# Patient Record
Sex: Female | Born: 1975 | Race: Black or African American | Hispanic: No | Marital: Married | Smoking: Former smoker
Health system: Southern US, Community
[De-identification: ages and names within clinical notes are randomized; demographics above are authoritative.]

## PROBLEM LIST (undated history)

## (undated) DIAGNOSIS — R112 Nausea with vomiting, unspecified: Secondary | ICD-10-CM

## (undated) DIAGNOSIS — R202 Paresthesia of skin: Secondary | ICD-10-CM

## (undated) HISTORY — DX: Paresthesia of skin: R20.2

## (undated) HISTORY — PX: BRAIN TUMOR EXCISION: SHX577

---

## 2003-01-27 ENCOUNTER — Other Ambulatory Visit: Admission: RE | Admit: 2003-01-27 | Discharge: 2003-01-27 | Payer: Self-pay | Admitting: *Deleted

## 2003-04-21 ENCOUNTER — Other Ambulatory Visit: Admission: RE | Admit: 2003-04-21 | Discharge: 2003-04-21 | Payer: Self-pay | Admitting: *Deleted

## 2004-01-20 ENCOUNTER — Other Ambulatory Visit: Admission: RE | Admit: 2004-01-20 | Discharge: 2004-01-20 | Payer: Self-pay | Admitting: *Deleted

## 2005-03-02 ENCOUNTER — Other Ambulatory Visit: Admission: RE | Admit: 2005-03-02 | Discharge: 2005-03-02 | Payer: Self-pay | Admitting: *Deleted

## 2006-03-07 ENCOUNTER — Other Ambulatory Visit: Admission: RE | Admit: 2006-03-07 | Discharge: 2006-03-07 | Payer: Self-pay | Admitting: *Deleted

## 2007-05-13 ENCOUNTER — Inpatient Hospital Stay (HOSPITAL_COMMUNITY): Admission: AD | Admit: 2007-05-13 | Discharge: 2007-05-13 | Payer: Self-pay | Admitting: Obstetrics and Gynecology

## 2007-05-14 ENCOUNTER — Inpatient Hospital Stay (HOSPITAL_COMMUNITY): Admission: AD | Admit: 2007-05-14 | Discharge: 2007-05-16 | Payer: Self-pay | Admitting: Obstetrics and Gynecology

## 2008-05-27 ENCOUNTER — Ambulatory Visit (HOSPITAL_COMMUNITY): Admission: RE | Admit: 2008-05-27 | Discharge: 2008-05-27 | Payer: Self-pay | Admitting: Gynecology

## 2008-10-16 ENCOUNTER — Other Ambulatory Visit: Admission: RE | Admit: 2008-10-16 | Discharge: 2008-10-16 | Payer: Self-pay | Admitting: Gynecology

## 2008-11-07 HISTORY — PX: ECTOPIC PREGNANCY SURGERY: SHX613

## 2009-07-22 ENCOUNTER — Encounter (INDEPENDENT_AMBULATORY_CARE_PROVIDER_SITE_OTHER): Payer: Self-pay | Admitting: Obstetrics and Gynecology

## 2009-07-22 ENCOUNTER — Ambulatory Visit (HOSPITAL_COMMUNITY): Admission: AD | Admit: 2009-07-22 | Discharge: 2009-07-23 | Payer: Self-pay | Admitting: Obstetrics and Gynecology

## 2009-10-06 ENCOUNTER — Ambulatory Visit (HOSPITAL_COMMUNITY): Admission: RE | Admit: 2009-10-06 | Discharge: 2009-10-06 | Payer: Self-pay | Admitting: Gynecology

## 2009-11-07 HISTORY — PX: HYSTEROSCOPY: SHX211

## 2010-11-05 ENCOUNTER — Ambulatory Visit (HOSPITAL_COMMUNITY)
Admission: RE | Admit: 2010-11-05 | Discharge: 2010-11-05 | Payer: Self-pay | Source: Home / Self Care | Attending: Obstetrics and Gynecology | Admitting: Obstetrics and Gynecology

## 2010-11-26 NOTE — Op Note (Signed)
  NAME:  Olivia Bailey, Olivia Bailey               ACCOUNT NO.:  [id number]  MEDICAL RECORD NO.:  [id number]          PATIENT TYPE:  AMB  LOCATION:  SDC                           FACILITY:  [facility]  PHYSICIAN:  Michelle L. Grewal, M.D.DATE OF BIRTH:  04-23-1976  DATE OF PROCEDURE:  11/05/2010 DATE OF DISCHARGE:  11/05/2010                              OPERATIVE REPORT   PREOPERATIVE DIAGNOSIS:  Infertility and intrauterine adhesion.  POSTOPERATIVE DIAGNOSIS:  Normal uterine cavity and infertility.  PROCEDURE:  Cervical dilation and hysteroscopy.  SURGEON:  Michelle L. Vincente Poli, MD  ANESTHESIA:  MAC with local.  FINDINGS:  Normal cavity.  SPECIMENS:  None.  ESTIMATED BLOOD LOSS:  Minimal.  COMPLICATIONS:  None.  DESCRIPTION OF PROCEDURE:  The patient was taken to the operating room. Anesthesia was administered.  She was then prepped and draped in usual fashion.  In-and-out catheter was used to empty the bladder.  The speculum was inserted into the vagina.  The cervix grasped with a tenaculum.  Of note, there was NuvaRing in the vagina that was removed prior to this, was then inserted at the end of the case.  The hysteroscope was inserted to the uterine cavity after the cervix was gently dilated using Pratt dilators and after paracervical block was performed.  The entire uterine cavity was inspected.  It was noted to be very thin and atrophic.  Tubal ostia were seen and noted to be normal. There was no evidence of any intrauterine adhesions, cavity contour was normal.  No fibroid.  No polyp noted.  Multiple pictures were taken. All instruments were removed from the vagina.  All sponge, lap, and instrument counts were correct x2.  The patient went to recovery room in stable condition.     Michelle L. Vincente Poli, M.D.     Florestine Avers  D:  11/05/2010  T:  11/06/2010  Job:  [id number]  Electronically Signed by Marcelle Overlie M.D. on 11/26/2010 08:43:35 AM

## 2011-01-17 LAB — CBC
MCH: 28.1 pg (ref 26.0–34.0)
MCHC: 33.3 g/dL (ref 30.0–36.0)
MCV: 84.4 fL (ref 78.0–100.0)
Platelets: 224 10*3/uL (ref 150–400)
RBC: 4.41 MIL/uL (ref 3.87–5.11)

## 2011-01-17 LAB — PREGNANCY, URINE: Preg Test, Ur: NEGATIVE

## 2011-02-11 LAB — CBC
HCT: 33.9 % — ABNORMAL LOW (ref 36.0–46.0)
Hemoglobin: 11.5 g/dL — ABNORMAL LOW (ref 12.0–15.0)
MCHC: 33.9 g/dL (ref 30.0–36.0)
MCHC: 32.8 g/dL (ref 30.0–36.0)
MCV: 88.2 fL (ref 78.0–100.0)
Platelets: 198 10*3/uL (ref 150–400)
Platelets: 236 10*3/uL (ref 150–400)
RBC: 4.26 MIL/uL (ref 3.87–5.11)
RDW: 13 % (ref 11.5–15.5)
WBC: 6.2 10*3/uL (ref 4.0–10.5)

## 2011-02-11 LAB — TYPE AND SCREEN
ABO/RH(D): O POS
Antibody Screen: NEGATIVE

## 2011-02-15 LAB — ABO/RH: RH Type: POSITIVE

## 2011-02-15 LAB — RPR: RPR: NONREACTIVE

## 2011-02-15 LAB — HEPATITIS B SURFACE ANTIGEN: Hepatitis B Surface Ag: NEGATIVE

## 2011-07-08 ENCOUNTER — Ambulatory Visit (INDEPENDENT_AMBULATORY_CARE_PROVIDER_SITE_OTHER): Payer: Self-pay | Admitting: *Deleted

## 2011-07-08 DIAGNOSIS — O30009 Twin pregnancy, unspecified number of placenta and unspecified number of amniotic sacs, unspecified trimester: Secondary | ICD-10-CM

## 2011-07-15 ENCOUNTER — Ambulatory Visit (INDEPENDENT_AMBULATORY_CARE_PROVIDER_SITE_OTHER): Payer: BC Managed Care – PPO | Admitting: *Deleted

## 2011-07-15 VITALS — BP 110/70

## 2011-07-15 DIAGNOSIS — O30009 Twin pregnancy, unspecified number of placenta and unspecified number of amniotic sacs, unspecified trimester: Secondary | ICD-10-CM

## 2011-07-15 NOTE — Progress Notes (Signed)
P = 93  Pt here for NST today

## 2011-07-15 NOTE — Progress Notes (Deleted)
P = 93   Pt here for NST today.  Copy of report sent to Dr. Renaldo Fiddler w/pt today

## 2011-07-22 ENCOUNTER — Ambulatory Visit (INDEPENDENT_AMBULATORY_CARE_PROVIDER_SITE_OTHER): Payer: BC Managed Care – PPO | Admitting: *Deleted

## 2011-07-22 VITALS — BP 110/67

## 2011-07-22 DIAGNOSIS — O30009 Twin pregnancy, unspecified number of placenta and unspecified number of amniotic sacs, unspecified trimester: Secondary | ICD-10-CM

## 2011-07-22 NOTE — Progress Notes (Signed)
P = 83    NST today, then prenatal visit to follow w/Dr. Vincente Poli @ office

## 2011-07-27 ENCOUNTER — Ambulatory Visit (INDEPENDENT_AMBULATORY_CARE_PROVIDER_SITE_OTHER): Payer: BC Managed Care – PPO | Admitting: *Deleted

## 2011-07-27 VITALS — BP 116/69

## 2011-07-27 DIAGNOSIS — O30009 Twin pregnancy, unspecified number of placenta and unspecified number of amniotic sacs, unspecified trimester: Secondary | ICD-10-CM

## 2011-07-27 NOTE — Progress Notes (Signed)
P = 87   NST only today- to office appt. with Dr. Marcelle Overlie after NST

## 2011-07-29 ENCOUNTER — Other Ambulatory Visit: Payer: BC Managed Care – PPO

## 2011-08-01 NOTE — Patient Instructions (Addendum)
   Your procedure is scheduled on: [facility]  Enter through the Main Entrance of [facility] at: 11:30AM Pick up the phone at the desk and dial [phone]  Please call this number if you have any problems the morning of surgery: [phone]  Remember: Do not eat food after midnight  Do not drink clear liquids after 9am Take these medicines the morning of surgery with a SIP OF WATER:  Do not wear jewelry, make-up, or FINGER nail polish Do not wear lotions, powders, or perfumes.  You may wear deodorant. Do not shave 48 hours prior to surgery. Do not bring valuables to the hospital. Leave suitcase in the car. After Surgery it may be brought to your room. For patients being admitted to the hospital, checkout time is 11:00am the day of discharge.    Remember to use your hibiclens as instructed.

## 2011-08-02 ENCOUNTER — Encounter (HOSPITAL_COMMUNITY): Payer: Self-pay

## 2011-08-04 ENCOUNTER — Encounter (HOSPITAL_COMMUNITY): Payer: Self-pay

## 2011-08-04 ENCOUNTER — Encounter (HOSPITAL_COMMUNITY)
Admission: RE | Admit: 2011-08-04 | Discharge: 2011-08-04 | Disposition: A | Discharge status: Home / Self Care | Payer: BC Managed Care – PPO | Source: Ambulatory Visit | Attending: Obstetrics and Gynecology | Admitting: Obstetrics and Gynecology

## 2011-08-04 HISTORY — DX: Nausea with vomiting, unspecified: R11.2

## 2011-08-04 LAB — CBC
Hemoglobin: 10.1 g/dL — ABNORMAL LOW (ref 12.0–15.0)
MCH: 22.8 pg — ABNORMAL LOW (ref 26.0–34.0)
MCV: 75.6 fL — ABNORMAL LOW (ref 78.0–100.0)
Platelets: 157 10*3/uL (ref 150–400)
RBC: 4.43 MIL/uL (ref 3.87–5.11)
WBC: 8.3 10*3/uL (ref 4.0–10.5)

## 2011-08-04 LAB — SURGICAL PCR SCREEN: MRSA, PCR: NEGATIVE

## 2011-08-04 LAB — RPR: RPR Ser Ql: NONREACTIVE

## 2011-08-05 ENCOUNTER — Ambulatory Visit (INDEPENDENT_AMBULATORY_CARE_PROVIDER_SITE_OTHER): Payer: BC Managed Care – PPO | Admitting: *Deleted

## 2011-08-05 VITALS — BP 110/77

## 2011-08-05 DIAGNOSIS — O30009 Twin pregnancy, unspecified number of placenta and unspecified number of amniotic sacs, unspecified trimester: Secondary | ICD-10-CM

## 2011-08-05 NOTE — Progress Notes (Signed)
P = 86     NST only today. Office appt w/Dr. Vincente Poli today following NST.  Pt is scheduled for C/S on 08/10/11

## 2011-08-10 ENCOUNTER — Inpatient Hospital Stay (HOSPITAL_COMMUNITY): Payer: BC Managed Care – PPO | Admitting: Anesthesiology

## 2011-08-10 ENCOUNTER — Inpatient Hospital Stay (HOSPITAL_COMMUNITY)
Admission: RE | Admit: 2011-08-10 | Discharge: 2011-08-13 | DRG: 370 | Disposition: A | Discharge status: Home / Self Care | Payer: BC Managed Care – PPO | Source: Ambulatory Visit | Attending: Obstetrics and Gynecology | Admitting: Obstetrics and Gynecology

## 2011-08-10 ENCOUNTER — Encounter (HOSPITAL_COMMUNITY): Payer: Self-pay | Admitting: Anesthesiology

## 2011-08-10 ENCOUNTER — Encounter (HOSPITAL_COMMUNITY)
Admission: RE | Disposition: A | Discharge status: Home / Self Care | Payer: Self-pay | Source: Ambulatory Visit | Attending: Obstetrics and Gynecology

## 2011-08-10 ENCOUNTER — Encounter (HOSPITAL_COMMUNITY): Payer: Self-pay | Admitting: *Deleted

## 2011-08-10 ENCOUNTER — Other Ambulatory Visit: Payer: Self-pay | Admitting: Obstetrics and Gynecology

## 2011-08-10 DIAGNOSIS — D649 Anemia, unspecified: Secondary | ICD-10-CM | POA: Diagnosis not present

## 2011-08-10 DIAGNOSIS — Z302 Encounter for sterilization: Secondary | ICD-10-CM

## 2011-08-10 DIAGNOSIS — O30009 Twin pregnancy, unspecified number of placenta and unspecified number of amniotic sacs, unspecified trimester: Secondary | ICD-10-CM

## 2011-08-10 DIAGNOSIS — O3090 Multiple gestation, unspecified, unspecified trimester: Principal | ICD-10-CM | POA: Diagnosis present

## 2011-08-10 DIAGNOSIS — O9903 Anemia complicating the puerperium: Secondary | ICD-10-CM | POA: Diagnosis not present

## 2011-08-10 DIAGNOSIS — Z01812 Encounter for preprocedural laboratory examination: Secondary | ICD-10-CM

## 2011-08-10 DIAGNOSIS — Z01818 Encounter for other preprocedural examination: Secondary | ICD-10-CM

## 2011-08-10 DIAGNOSIS — Z98891 History of uterine scar from previous surgery: Secondary | ICD-10-CM

## 2011-08-10 SURGERY — Surgical Case
Anesthesia: Spinal | Laterality: Left | Wound class: Clean Contaminated

## 2011-08-10 MED ORDER — BISACODYL 10 MG RE SUPP
10.0000 mg | Freq: Every day | RECTAL | Status: DC | PRN
  Administered 2011-08-13: 10 mg via RECTAL
  Filled 2011-08-10: qty 1

## 2011-08-10 MED ORDER — ONDANSETRON HCL 4 MG/2ML IJ SOLN
4.0000 mg | INTRAMUSCULAR | Status: DC | PRN
  Administered 2011-08-10: 4 mg via INTRAVENOUS
  Filled 2011-08-10: qty 2

## 2011-08-10 MED ORDER — OXYCODONE-ACETAMINOPHEN 5-325 MG PO TABS
1.0000 | ORAL_TABLET | ORAL | Status: DC | PRN
  Administered 2011-08-11 – 2011-08-12 (×5): 1 via ORAL
  Administered 2011-08-12: 2 via ORAL
  Administered 2011-08-12 – 2011-08-13 (×5): 1 via ORAL
  Filled 2011-08-10 (×9): qty 1
  Filled 2011-08-10: qty 2
  Filled 2011-08-10: qty 1

## 2011-08-10 MED ORDER — NALOXONE HCL 0.4 MG/ML IJ SOLN: 0.4000 mg | INTRAMUSCULAR | Status: DC | PRN

## 2011-08-10 MED ORDER — DIPHENHYDRAMINE HCL 25 MG PO CAPS
25.0000 mg | ORAL_CAPSULE | ORAL | Status: DC | PRN
  Administered 2011-08-11 – 2011-08-13 (×7): 25 mg via ORAL
  Filled 2011-08-10 (×7): qty 1

## 2011-08-10 MED ORDER — GENTAMICIN IN SALINE 1-0.9 MG/ML-% IV SOLN: 100.0000 mg | Freq: Once | INTRAVENOUS | Status: DC

## 2011-08-10 MED ORDER — KETOROLAC TROMETHAMINE 30 MG/ML IJ SOLN
30.0000 mg | Freq: Four times a day (QID) | INTRAMUSCULAR | Status: AC | PRN
  Administered 2011-08-10 (×2): 30 mg via INTRAVENOUS
  Filled 2011-08-10: qty 1

## 2011-08-10 MED ORDER — OXYTOCIN 20 UNITS IN LACTATED RINGERS INFUSION - SIMPLE
125.0000 mL/h | INTRAVENOUS | Status: AC
  Administered 2011-08-10: 125 mL/h via INTRAVENOUS

## 2011-08-10 MED ORDER — SODIUM CHLORIDE 0.9 % IJ SOLN: 3.0000 mL | INTRAMUSCULAR | Status: DC | PRN

## 2011-08-10 MED ORDER — IBUPROFEN 600 MG PO TABS
600.0000 mg | ORAL_TABLET | Freq: Four times a day (QID) | ORAL | Status: DC | PRN
  Filled 2011-08-10 (×10): qty 1

## 2011-08-10 MED ORDER — ONDANSETRON HCL 4 MG/2ML IJ SOLN
INTRAMUSCULAR | Status: AC
  Filled 2011-08-10: qty 2

## 2011-08-10 MED ORDER — KETOROLAC TROMETHAMINE 30 MG/ML IJ SOLN: 30.0000 mg | Freq: Four times a day (QID) | INTRAMUSCULAR | Status: AC | PRN

## 2011-08-10 MED ORDER — GENTAMICIN SULFATE 40 MG/ML IJ SOLN
Freq: Once | INTRAVENOUS | Status: DC
  Filled 2011-08-10: qty 2.5

## 2011-08-10 MED ORDER — METOCLOPRAMIDE HCL 5 MG/ML IJ SOLN: 10.0000 mg | Freq: Once | INTRAMUSCULAR | Status: DC | PRN

## 2011-08-10 MED ORDER — OXYTOCIN 20 UNITS IN LACTATED RINGERS INFUSION - SIMPLE
INTRAVENOUS | Status: AC
  Administered 2011-08-10: 125 mL/h via INTRAVENOUS
  Filled 2011-08-10: qty 1000

## 2011-08-10 MED ORDER — HYDROMORPHONE HCL 1 MG/ML IJ SOLN
0.2500 mg | INTRAMUSCULAR | Status: DC | PRN
  Administered 2011-08-10 (×2): 0.5 mg via INTRAVENOUS

## 2011-08-10 MED ORDER — SENNOSIDES-DOCUSATE SODIUM 8.6-50 MG PO TABS
2.0000 | ORAL_TABLET | Freq: Every day | ORAL | Status: DC
  Administered 2011-08-11 – 2011-08-12 (×2): 2 via ORAL

## 2011-08-10 MED ORDER — GENTAMICIN SULFATE 40 MG/ML IJ SOLN
INTRAVENOUS | Status: DC | PRN
  Administered 2011-08-10: 100 mL via INTRAVENOUS

## 2011-08-10 MED ORDER — LACTATED RINGERS IV SOLN
INTRAVENOUS | Status: DC
  Administered 2011-08-10 – 2011-08-11 (×2): via INTRAVENOUS

## 2011-08-10 MED ORDER — FENTANYL CITRATE 0.05 MG/ML IJ SOLN
INTRAMUSCULAR | Status: AC
  Filled 2011-08-10: qty 2

## 2011-08-10 MED ORDER — SCOPOLAMINE 1 MG/3DAYS TD PT72
1.0000 | MEDICATED_PATCH | Freq: Once | TRANSDERMAL | Status: DC
  Administered 2011-08-10: 1.5 mg via TRANSDERMAL

## 2011-08-10 MED ORDER — BUPIVACAINE IN DEXTROSE 0.75-8.25 % IT SOLN
INTRATHECAL | Status: DC | PRN
Start: 2011-08-10 — End: 2011-08-10
  Administered 2011-08-10: 1.4 mL via INTRATHECAL

## 2011-08-10 MED ORDER — WITCH HAZEL-GLYCERIN EX PADS: 1.0000 "application " | MEDICATED_PAD | CUTANEOUS | Status: DC | PRN

## 2011-08-10 MED ORDER — MORPHINE SULFATE (PF) 0.5 MG/ML IJ SOLN
INTRAMUSCULAR | Status: DC | PRN
  Administered 2011-08-10: .15 mg via INTRATHECAL

## 2011-08-10 MED ORDER — ONDANSETRON HCL 4 MG/2ML IJ SOLN
INTRAMUSCULAR | Status: DC | PRN
  Administered 2011-08-10: 4 mg via INTRAVENOUS

## 2011-08-10 MED ORDER — TETANUS-DIPHTH-ACELL PERTUSSIS 5-2.5-18.5 LF-MCG/0.5 IM SUSP: 0.5000 mL | Freq: Once | INTRAMUSCULAR | Status: DC

## 2011-08-10 MED ORDER — DIPHENHYDRAMINE HCL 25 MG PO CAPS
25.0000 mg | ORAL_CAPSULE | Freq: Four times a day (QID) | ORAL | Status: DC | PRN
  Filled 2011-08-10: qty 1

## 2011-08-10 MED ORDER — FENTANYL CITRATE 0.05 MG/ML IJ SOLN: 25.0000 ug | INTRAMUSCULAR | Status: DC | PRN

## 2011-08-10 MED ORDER — CITRIC ACID-SODIUM CITRATE 334-500 MG/5ML PO SOLN: 30.0000 mL | Freq: Once | ORAL | Status: DC

## 2011-08-10 MED ORDER — NALBUPHINE HCL 10 MG/ML IJ SOLN: 5.0000 mg | INTRAMUSCULAR | Status: DC | PRN

## 2011-08-10 MED ORDER — BUPIVACAINE HCL (PF) 0.25 % IJ SOLN
INTRAMUSCULAR | Status: DC | PRN
  Administered 2011-08-10: 10 mL

## 2011-08-10 MED ORDER — SIMETHICONE 80 MG PO CHEW
80.0000 mg | CHEWABLE_TABLET | ORAL | Status: DC | PRN
  Administered 2011-08-12: 80 mg via ORAL

## 2011-08-10 MED ORDER — FLEET ENEMA 7-19 GM/118ML RE ENEM: 1.0000 | ENEMA | RECTAL | Status: DC | PRN

## 2011-08-10 MED ORDER — MEDROXYPROGESTERONE ACETATE 150 MG/ML IM SUSP: 150.0000 mg | INTRAMUSCULAR | Status: DC | PRN

## 2011-08-10 MED ORDER — OXYTOCIN 20 UNITS IN LACTATED RINGERS INFUSION - SIMPLE
INTRAVENOUS | Status: DC | PRN
  Administered 2011-08-10: 20 [IU] via INTRAVENOUS

## 2011-08-10 MED ORDER — FAMOTIDINE 20 MG PO TABS: 20.0000 mg | ORAL_TABLET | Freq: Once | ORAL | Status: DC

## 2011-08-10 MED ORDER — PANTOPRAZOLE SODIUM 40 MG PO TBEC: 40.0000 mg | DELAYED_RELEASE_TABLET | Freq: Once | ORAL | Status: DC

## 2011-08-10 MED ORDER — DEXTROSE IN LACTATED RINGERS 5 % IV SOLN: INTRAVENOUS | Status: DC

## 2011-08-10 MED ORDER — KETOROLAC TROMETHAMINE 30 MG/ML IJ SOLN
INTRAMUSCULAR | Status: AC
  Administered 2011-08-10: 30 mg via INTRAVENOUS
  Filled 2011-08-10: qty 1

## 2011-08-10 MED ORDER — SCOPOLAMINE 1 MG/3DAYS TD PT72
MEDICATED_PATCH | TRANSDERMAL | Status: AC
  Filled 2011-08-10: qty 1

## 2011-08-10 MED ORDER — MORPHINE SULFATE 0.5 MG/ML IJ SOLN
INTRAMUSCULAR | Status: AC
  Filled 2011-08-10: qty 10

## 2011-08-10 MED ORDER — DIPHENHYDRAMINE HCL 50 MG/ML IJ SOLN: 25.0000 mg | INTRAMUSCULAR | Status: DC | PRN

## 2011-08-10 MED ORDER — DEXTROSE 5 % IV SOLN: 1.0000 g | INTRAVENOUS | Status: DC

## 2011-08-10 MED ORDER — DIPHENHYDRAMINE HCL 50 MG/ML IJ SOLN: 12.5000 mg | INTRAMUSCULAR | Status: DC | PRN

## 2011-08-10 MED ORDER — CLINDAMYCIN PHOSPHATE 900 MG/50ML IV SOLN: 900.0000 mg | Freq: Once | INTRAVENOUS | Status: DC

## 2011-08-10 MED ORDER — PRENATAL PLUS 27-1 MG PO TABS
1.0000 | ORAL_TABLET | Freq: Every day | ORAL | Status: DC
  Filled 2011-08-10 (×2): qty 1

## 2011-08-10 MED ORDER — FENTANYL CITRATE 0.05 MG/ML IJ SOLN
INTRAMUSCULAR | Status: DC | PRN
  Administered 2011-08-10: 25 ug via INTRATHECAL

## 2011-08-10 MED ORDER — MENTHOL 3 MG MT LOZG: 1.0000 | LOZENGE | OROMUCOSAL | Status: DC | PRN

## 2011-08-10 MED ORDER — MEASLES, MUMPS & RUBELLA VAC ~~LOC~~ INJ: 0.5000 mL | INJECTION | Freq: Once | SUBCUTANEOUS | Status: DC

## 2011-08-10 MED ORDER — MEPERIDINE HCL 25 MG/ML IJ SOLN: 6.2500 mg | INTRAMUSCULAR | Status: DC | PRN

## 2011-08-10 MED ORDER — HYDROMORPHONE HCL 1 MG/ML IJ SOLN
INTRAMUSCULAR | Status: AC
  Filled 2011-08-10: qty 1

## 2011-08-10 MED ORDER — DIBUCAINE 1 % RE OINT: 1.0000 "application " | TOPICAL_OINTMENT | RECTAL | Status: DC | PRN

## 2011-08-10 MED ORDER — SIMETHICONE 80 MG PO CHEW
80.0000 mg | CHEWABLE_TABLET | Freq: Three times a day (TID) | ORAL | Status: DC
  Administered 2011-08-11 – 2011-08-13 (×6): 80 mg via ORAL

## 2011-08-10 MED ORDER — LACTATED RINGERS IV SOLN
INTRAVENOUS | Status: DC
  Administered 2011-08-10 (×2): via INTRAVENOUS

## 2011-08-10 MED ORDER — SODIUM CHLORIDE 0.9 % IV SOLN: 1.0000 ug/kg/h | INTRAVENOUS | Status: DC | PRN

## 2011-08-10 MED ORDER — IBUPROFEN 600 MG PO TABS
600.0000 mg | ORAL_TABLET | Freq: Four times a day (QID) | ORAL | Status: DC
  Administered 2011-08-11 – 2011-08-13 (×10): 600 mg via ORAL

## 2011-08-10 MED ORDER — ONDANSETRON HCL 4 MG PO TABS: 4.0000 mg | ORAL_TABLET | ORAL | Status: DC | PRN

## 2011-08-10 MED ORDER — METOCLOPRAMIDE HCL 10 MG PO TABS: 10.0000 mg | ORAL_TABLET | Freq: Once | ORAL | Status: DC

## 2011-08-10 MED ORDER — ZOLPIDEM TARTRATE 5 MG PO TABS: 5.0000 mg | ORAL_TABLET | Freq: Every evening | ORAL | Status: DC | PRN

## 2011-08-10 MED ORDER — OXYTOCIN 10 UNIT/ML IJ SOLN
INTRAMUSCULAR | Status: AC
  Filled 2011-08-10: qty 2

## 2011-08-10 MED ORDER — LANOLIN HYDROUS EX OINT: 1.0000 "application " | TOPICAL_OINTMENT | CUTANEOUS | Status: DC | PRN

## 2011-08-10 SURGICAL SUPPLY — 32 items
BARRIER ADHS 3X4 INTERCEED (GAUZE/BANDAGES/DRESSINGS) IMPLANT
BRR ADH 4X3 ABS CNTRL BYND (GAUZE/BANDAGES/DRESSINGS)
BRR ADH 6X5 SEPRAFILM 1 SHT (MISCELLANEOUS)
CHLORAPREP W/TINT 26ML (MISCELLANEOUS) ×2 IMPLANT
CLIP FILSHIE TUBAL LIGA STRL (Clip) ×1 IMPLANT
CLOTH BEACON ORANGE TIMEOUT ST (SAFETY) ×2 IMPLANT
CONTAINER PREFILL 10% NBF 15ML (MISCELLANEOUS) IMPLANT
DRSG COVADERM 4X6 (GAUZE/BANDAGES/DRESSINGS) ×1 IMPLANT
ELECT REM PT RETURN 9FT ADLT (ELECTROSURGICAL) ×2
ELECTRODE REM PT RTRN 9FT ADLT (ELECTROSURGICAL) ×1 IMPLANT
EXTRACTOR VACUUM M CUP 4 TUBE (SUCTIONS) IMPLANT
GLOVE BIO SURGEON STRL SZ 6.5 (GLOVE) ×4 IMPLANT
GOWN PREVENTION PLUS LG XLONG (DISPOSABLE) ×6 IMPLANT
KIT ABG SYR 3ML LUER SLIP (SYRINGE) IMPLANT
NDL HYPO 25X5/8 SAFETYGLIDE (NEEDLE) ×1 IMPLANT
NEEDLE HYPO 22GX1.5 SAFETY (NEEDLE) ×2 IMPLANT
NEEDLE HYPO 25X5/8 SAFETYGLIDE (NEEDLE) ×2 IMPLANT
NS IRRIG 1000ML POUR BTL (IV SOLUTION) ×2 IMPLANT
PACK C SECTION WH (CUSTOM PROCEDURE TRAY) ×2 IMPLANT
SEPRAFILM MEMBRANE 5X6 (MISCELLANEOUS) IMPLANT
SLEEVE SCD COMPRESS KNEE MED (MISCELLANEOUS) IMPLANT
STAPLER VISISTAT 35W (STAPLE) IMPLANT
SUT CHROMIC 0 CTX 36 (SUTURE) ×4 IMPLANT
SUT PLAIN 0 NONE (SUTURE) IMPLANT
SUT PLAIN 2 0 XLH (SUTURE) IMPLANT
SUT VIC AB 0 CT1 27 (SUTURE) ×6
SUT VIC AB 0 CT1 27XBRD ANBCTR (SUTURE) ×3 IMPLANT
SUT VIC AB 4-0 KS 27 (SUTURE) IMPLANT
SYRINGE 10CC LL (SYRINGE) ×2 IMPLANT
TOWEL OR 17X24 6PK STRL BLUE (TOWEL DISPOSABLE) ×4 IMPLANT
TRAY FOLEY CATH 14FR (SET/KITS/TRAYS/PACK) ×2 IMPLANT
WATER STERILE IRR 1000ML POUR (IV SOLUTION) ×2 IMPLANT

## 2011-08-10 NOTE — Op Note (Deleted)
NAMEVANNAH, NADAL               ACCOUNT NO.:  [id number]  MEDICAL RECORD NO.:  [id number]  LOCATION:                                 FACILITY:  PHYSICIAN:  Darden Flemister L. Vincente Poli, M.D.    DATE OF BIRTH:  DATE OF PROCEDURE:  08/10/2011 DATE OF DISCHARGE:                              OPERATIVE REPORT   PREOPERATIVE DIAGNOSES: 1. Twin intrauterine pregnancy at 36 weeks and 1 day. 2. Breech presentation. 3. Desires permanent sterilization.  POSTOPERATIVE DIAGNOSES: 1. Twin intrauterine pregnancy at 36 weeks and 1 day. 2. Breech presentation. 3. Desires permanent sterilization.  PROCEDURE: 1. Primary low transverse cesarean section. 2. Left tubal ligation with Filshie clip.  SURGEON:  Valree Feild L. Cledith Abdou, MD  ANESTHESIA:  Spinal.  ESTIMATED BLOOD LOSS:  500 mL.  FINDINGS:  Twin A female in frank breech presentation.  Apgar's 9 at 1 minute and 9 at 5 minutes.  Twin B female in double footling breech presentation.  Apgar's 9 at 1 minute and 9 at 5 minutes.  Absent right fallopian tube from history of right salpingectomy from right ectopic in the past.  PATHOLOGY:  Placenta.  DRAINS:  Foley.  PROCEDURE:  The patient was taken to the operating room.  Her spinal was placed.  After time-out was performed, she was prepped and draped in the usual sterile fashion.  A Foley catheter was inserted.  A low transcervical transverse incision was made, carried down to the fascia. Fascia scored in the midline extended laterally.  Rectus muscles were separated in midline.  The peritoneum was entered bluntly.  The peritoneal incision was then stretched.  The lower uterine segment was identified.  The bladder flap was created sharply and then digitally. The bladder blade was then readjusted.  A low transverse incision was made in the uterus.  Baby A was in frank breech presentation and delivered easily without any difficulty.  The baby was a female infant. After A was delivered, then I  performed a amniotomy of twin B sac and twin B came down to the incision in double footling breech.  We delivered the baby via complete breech extraction.  The baby was a female infant and again was easily removed.  The cord was clamped and cut. . After the babies were taken to the nursery, cord blood had been obtained.  The placentas were removed and noted to be normal, intact x2 with 3-vessel cords each and they were sent to pathology.  The uterus was firm.  The right ovary and left ovary were normal.  Right fallopian tube was absent due to history of right salpingectomy.  Left fallopian tube was normal.  We cleared out the uterus of all clots and debris and closed the uterine incision in 2 layers using O chromic in a running locked stitch.  Hemostasis was excellent.  I then placed a Filshie clip across the midportion of the left fallopian tube and this was done without difficulty.  The uterus was returned to the abdomen.  Irrigation was performed.  Hemostasis was excellent.  The peritoneum was closed using 0 Vicryl.  The rectus muscles were reapproximated using the same 0 Vicryl.  The fascia was closed  using 0 Vicryl and running stitch.  After irrigation of subcutaneous layer, the skin was closed with staples.  All sponge, lap, and instrument counts were correct x2.  The patient went to recovery room in stable condition.     Juanell Saffo L. Vincente Poli, M.D.     Florestine Avers  D:  08/10/2011  T:  08/10/2011  Job:  [id number]

## 2011-08-10 NOTE — Anesthesia Procedure Notes (Signed)
Spinal Block  Patient location during procedure: OR Start time: 08/10/2011 1:19 PM Staffing Anesthesiologist: Claressa Hughley A. Performed by: anesthesiologist  Preanesthetic Checklist Completed: patient identified, site marked, surgical consent, pre-op evaluation, timeout performed, IV checked, risks and benefits discussed and monitors and equipment checked Spinal Block Patient position: sitting Prep: site prepped and draped and DuraPrep Patient monitoring: heart rate, cardiac monitor, continuous pulse ox and blood pressure Approach: midline Location: L3-4 Injection technique: single-shot Needle Needle type: Sprotte  Needle gauge: 24 G Needle length: 9 cm Needle insertion depth: 5 cm Assessment Sensory level: T2 Events: paresthesia Additional Notes Patient tolerated procedure well. Transient paresthesia right leg. Adequate surgical anesthetic level.

## 2011-08-10 NOTE — Op Note (Signed)
NAME:  Olivia Bailey, Olivia Bailey               ACCOUNT NO.:  [id number]  MEDICAL RECORD NO.:  [id number]  LOCATION:                                 FACILITY:  PHYSICIAN:  Elisah Parmer L. Leeya Rusconi, M.D.    DATE OF BIRTH:  DATE OF PROCEDURE:  08/10/2011 DATE OF DISCHARGE:                              OPERATIVE REPORT   PREOPERATIVE DIAGNOSES: 1. Twin intrauterine pregnancy at 36 weeks and 1 day. 2. Breech presentation. 3. Desires permanent sterilization.  POSTOPERATIVE DIAGNOSES: 1. Twin intrauterine pregnancy at 36 weeks and 1 day. 2. Breech presentation. 3. Desires permanent sterilization.  PROCEDURE: 1. Primary low transverse cesarean section. 2. Left tubal ligation with Filshie clip.  SURGEON:  Matisse Roskelley L. Breanda Greenlaw, MD  ANESTHESIA:  Spinal.  ESTIMATED BLOOD LOSS:  500 mL.  FINDINGS:  Twin A female in frank breech presentation.  Apgar's 9 at 1 minute and 9 at 5 minutes.  Twin B female in double footling breech presentation.  Apgar's 9 at 1 minute and 9 at 5 minutes.  Absent right fallopian tube from history of right salpingectomy from right ectopic in the past.  PATHOLOGY:  Placenta.  DRAINS:  Foley.  PROCEDURE:  The patient was taken to the operating room.  Her spinal was placed.  After time-out was performed, she was prepped and draped in the usual sterile fashion.  A Foley catheter was inserted.  A low transcervical transverse incision was made, carried down to the fascia. Fascia scored in the midline extended laterally.  Rectus muscles were separated in midline.  The peritoneum was entered bluntly.  The peritoneal incision was then stretched.  The lower uterine segment was identified.  The bladder flap was created sharply and then digitally. The bladder blade was then readjusted.  A low transverse incision was made in the uterus.  Baby A was in frank breech presentation and delivered easily without any difficulty.  The baby was a female infant. After A was delivered, then I  performed a amniotomy of twin B sac and twin B came down to the incision in double footling breech.  We delivered the baby via complete breech extraction.  The baby was a female infant and again was easily removed.  The cord was clamped and cut. . After the babies were taken to the nursery, cord blood had been obtained.  The placentas were removed and noted to be normal, intact x2 with 3-vessel cords each and they were sent to pathology.  The uterus was firm.  The right ovary and left ovary were normal.  Right fallopian tube was absent due to history of right salpingectomy.  Left fallopian tube was normal.  We cleared out the uterus of all clots and debris and closed the uterine incision in 2 layers using O chromic in a running locked stitch.  Hemostasis was excellent.  I then placed a Filshie clip across the midportion of the left fallopian tube and this was done without difficulty.  The uterus was returned to the abdomen.  Irrigation was performed.  Hemostasis was excellent.  The peritoneum was closed using 0 Vicryl.  The rectus muscles were reapproximated using the same 0 Vicryl.  The fascia was closed   using 0 Vicryl and running stitch.  After irrigation of subcutaneous layer, the skin was closed with staples.  All sponge, lap, and instrument counts were correct x2.  The patient went to recovery room in stable condition.     Aaliayah Miao L. Vani Gunner, M.D.     MLG/MEDQ  D:  08/10/2011  T:  08/10/2011  Job:  [id number] 

## 2011-08-10 NOTE — H&P (Signed)
This is a 35 year old G5 P1031 at 20 w 1 day presents for C Section for Twin gestation with presenting twin is Breech.  She has had one NSVD. She conceived via IVF. PNC see [facility]  History of Right ectopic pregnancy status post right salpingectoy she desires permanent sterilization and left tubal ligation today with her C-Section.  AF VSS  Gen alert and oriented Lung CTA B Car RRR Abd soft Gravid  Cervix closed Ext no edema  IMP TWIN IUP AT 36 1/7 BREECH/BREECH Presentation Desires Permanent sterilization  PLAN PRIMARY LTCS Left tubal ligation Risks discussed with patient

## 2011-08-10 NOTE — Progress Notes (Signed)
H and P on chart No significant changes Proceed with Primary LTCS and Left tubal ligation

## 2011-08-10 NOTE — Transfer of Care (Signed)
Immediate Anesthesia Transfer of Care Note  Patient: Olivia Bailey  Procedure(s) Performed:  CESAREAN SECTION WITH BILATERAL TUBAL LIGATION  Patient Location: PACU  Anesthesia Type: Spinal  Level of Consciousness: awake, alert  and oriented  Airway & Oxygen Therapy: Patient Spontanous Breathing  Post-op Assessment: Report given to PACU RN  Post vital signs: Reviewed and stable  Complications: No apparent anesthesia complications

## 2011-08-10 NOTE — Progress Notes (Signed)
Brief Op Note:  PRE OP DIAGNOSIS: IUP at 36 w 1 day Twins Breech/Breech Presentation Desires permanent sterilization   POST OP DIAGNOSIS: Same  Procedure:  Primary Low Transverse Cesarean Section Left Tubal Ligation with Filshie Clip  Surgeon:  Vincente Poli  Anesthesia: Spinal  EBL: 500 cc  Findings Twin A female Breech apgars 66 9 Twin B Female Breech Apgars 9 9  Comps  None Pathology Placentas x 2 to pathology  Drains Foley

## 2011-08-10 NOTE — Anesthesia Preprocedure Evaluation (Signed)
Anesthesia Evaluation  Name, MR# and DOB Patient awake  General Assessment Comment  Reviewed: Allergy & Precautions, H&P , NPO status , Patient's Chart, lab work & pertinent test results  History of Anesthesia Complications (+) PONV  Airway Mallampati: II TM Distance: >3 FB Neck ROM: Full    Dental No notable dental hx. (+) Teeth Intact   Pulmonary  clear to auscultation  Pulmonary exam normal       Cardiovascular Regular Normal    Neuro/Psych Negative Neurological ROS  Negative Psych ROS   GI/Hepatic negative GI ROS Neg liver ROS    Endo/Other  Negative Endocrine ROS  Renal/GU negative Renal ROS  Genitourinary negative   Musculoskeletal   Abdominal   Peds  Hematology negative hematology ROS (+)   Anesthesia Other Findings   Reproductive/Obstetrics (+) Pregnancy                           Anesthesia Physical Anesthesia Plan  ASA: II  Anesthesia Plan: Spinal   Post-op Pain Management:    Induction:   Airway Management Planned:   Additional Equipment:   Intra-op Plan:   Post-operative Plan:   Informed Consent: I have reviewed the patients History and Physical, chart, labs and discussed the procedure including the risks, benefits and alternatives for the proposed anesthesia with the patient or authorized representative who has indicated his/her understanding and acceptance.   Dental Advisory Given  Plan Discussed with: Anesthesiologist and CRNA  Anesthesia Plan Comments:         Anesthesia Quick Evaluation

## 2011-08-10 NOTE — Anesthesia Postprocedure Evaluation (Signed)
  Anesthesia Post-op Note  Patient: Olivia Bailey  Procedure(s) Performed:  CESAREAN SECTION WITH BILATERAL TUBAL LIGATION  Patient Location: PACU  Anesthesia Type: Spinal  Level of Consciousness: awake, alert  and oriented  Airway and Oxygen Therapy: Patient Spontanous Breathing  Post-op Pain: none  Post-op Assessment: Post-op Vital signs reviewed, Patient's Cardiovascular Status Stable, Respiratory Function Stable, Patent Airway, No signs of Nausea or vomiting, Pain level controlled, No headache and No backache  Post-op Vital Signs: Reviewed and stable  Complications: No apparent anesthesia complications

## 2011-08-11 LAB — CBC
Hemoglobin: 8.3 g/dL — ABNORMAL LOW (ref 12.0–15.0)
RBC: 3.5 MIL/uL — ABNORMAL LOW (ref 3.87–5.11)
WBC: 6.8 10*3/uL (ref 4.0–10.5)

## 2011-08-11 MED ORDER — FENTANYL CITRATE 0.05 MG/ML IJ SOLN
INTRAMUSCULAR | Status: AC
  Filled 2011-08-11: qty 2

## 2011-08-11 NOTE — Anesthesia Postprocedure Evaluation (Signed)
  Anesthesia Post-op Note  Patient: Olivia Bailey  Procedure(s) Performed:  CESAREAN SECTION WITH BILATERAL TUBAL LIGATION  Patient Location: PACU and Mother/Baby  Anesthesia Type: Epidural  Level of Consciousness: awake, alert  and oriented  Airway and Oxygen Therapy: Patient Spontanous Breathing  Post-op Pain: mild  Post-op Assessment: Post-op Vital signs reviewed and Patient's Cardiovascular Status Stable  Post-op Vital Signs: Reviewed and stable  Complications: No apparent anesthesia complications

## 2011-08-11 NOTE — Progress Notes (Signed)
Subjective: Postpartum Day 1: Cesarean Delivery Patient reports tolerating PO.    Objective: Vital signs in last 24 hours: Temp:  [98.8 F (37.1 C)-99.5 F (37.5 C)] 99.1 F (37.3 C) (07/14 0609) Pulse Rate:  [74-101] 74  (07/14 0609) Resp:  [18-20] 18  (07/14 0609) BP: (119-130)/(71-84) 119/71 mmHg (07/14 0609) SpO2:  [99 %] 99 % (07/13 1400)  Physical Exam:  General: alert, cooperative and no distress Lochia: appropriate Uterine Fundus: firm Incision: bandage with some blood DVT Evaluation: No evidence of DVT seen on physical exam.   Basename 05/20/11 0530 05/18/11 2030  HGB 9.6* 11.5*  HCT 27.3* 31.3*    Assessment/Plan: Status post Cesarean section. Doing well postoperatively.  Continue current care. Recheck CBC in am.  Anemia and low platlets No sxs of PIH  Olivia Bailey C 05/21/2011, 10:23 AM

## 2011-08-11 NOTE — Progress Notes (Signed)
Encounter addended by: Cephus Shelling on: 08/11/2011 10:54 AM<BR>     Documentation filed: Notes Section

## 2011-08-12 LAB — CBC
HCT: 26.2 % — ABNORMAL LOW (ref 36.0–46.0)
Hemoglobin: 8.1 g/dL — ABNORMAL LOW (ref 12.0–15.0)
MCV: 77.1 fL — ABNORMAL LOW (ref 78.0–100.0)
Platelets: 136 10*3/uL — ABNORMAL LOW (ref 150–400)
RBC: 3.4 MIL/uL — ABNORMAL LOW (ref 3.87–5.11)
WBC: 7.5 10*3/uL (ref 4.0–10.5)

## 2011-08-12 MED ORDER — FENTANYL CITRATE 0.05 MG/ML IJ SOLN
INTRAMUSCULAR | Status: AC
  Filled 2011-08-12: qty 2

## 2011-08-12 MED ORDER — MORPHINE SULFATE 0.5 MG/ML IJ SOLN
INTRAMUSCULAR | Status: AC
  Filled 2011-08-12: qty 10

## 2011-08-12 MED ORDER — ONDANSETRON HCL 4 MG/2ML IJ SOLN
INTRAMUSCULAR | Status: AC
  Filled 2011-08-12: qty 2

## 2011-08-12 MED ORDER — OXYTOCIN 10 UNIT/ML IJ SOLN
INTRAMUSCULAR | Status: AC
  Filled 2011-08-12: qty 2

## 2011-08-12 MED ORDER — PHENYLEPHRINE 40 MCG/ML (10ML) SYRINGE FOR IV PUSH (FOR BLOOD PRESSURE SUPPORT)
PREFILLED_SYRINGE | INTRAVENOUS | Status: AC
  Filled 2011-08-12: qty 5

## 2011-08-12 NOTE — Progress Notes (Signed)
Subjective: Postpartum Day 2 Cesarean Delivery Patient reports incisional pain and no problems voiding.    Objective: Vital signs in last 24 hours: Temp:  [98.2 F (36.8 C)-98.4 F (36.9 C)] 98.2 F (36.8 C) (10/05 0600) Pulse Rate:  [72-76] 72  (10/05 0600) Resp:  [18-20] 18  (10/05 0600) BP: (90-105)/(55-69) 90/55 mmHg (10/05 0600)  Physical Exam:  General: alert Lochia: appropriate Uterine Fundus: firm Incision: healing well, no significant drainage DVT Evaluation: No evidence of DVT seen on physical exam.   Basename 08/12/11 0515 08/11/11 0545  HGB 8.1* 8.3*  HCT 26.2* 26.9*    Assessment/Plan: Status post Cesarean section. Doing well postoperatively.  Continue current care Discharge tomorrow.  Olivia Bailey L 08/12/2011, 10:06 AM

## 2011-08-13 MED ORDER — OXYCODONE-ACETAMINOPHEN 5-325 MG PO TABS
1.0000 | ORAL_TABLET | ORAL | Status: AC | PRN
Start: 2011-08-13 — End: 2011-08-23

## 2011-08-13 MED ORDER — IBUPROFEN 600 MG PO TABS: 600.0000 mg | ORAL_TABLET | Freq: Four times a day (QID) | ORAL | Status: AC

## 2011-08-13 NOTE — Progress Notes (Signed)
Subjective: Postpartum Day 3 Cesarean Delivery Patient reports incisional pain, tolerating PO and no problems voiding.    Objective: Vital signs in last 24 hours: Temp:  [97.9 F (36.6 C)-98.5 F (36.9 C)] 97.9 F (36.6 C) (10/06 0540) Pulse Rate:  [74-101] 74  (10/06 0540) Resp:  [18] 18  (10/06 0540) BP: (97-117)/(68-78) 97/68 mmHg (10/06 0540)  Physical Exam:  General: alert Lochia: appropriate Uterine Fundus: firm Incision: healing well, no significant drainage DVT Evaluation: No evidence of DVT seen on physical exam.   Basename 08/12/11 0515 08/11/11 0545  HGB 8.1* 8.3*  HCT 26.2* 26.9*    Assessment/Plan: Status post Cesarean section. Doing well postoperatively.  Discharge home with standard precautions and return to clinic on Tuesday to remove staples Rx Ibuprofen  Rx Percocet.  Gabor Lusk L 08/13/2011, 8:02 AM

## 2011-08-13 NOTE — Discharge Summary (Signed)
Obstetric Discharge Summary Reason for Admission: cesarean section Prenatal Procedures: none Intrapartum Procedures: cesarean: low cervical, transverse Postpartum Procedures: none Complications-Operative and Postpartum: none Hemoglobin  Date Value Range Status  08/12/2011 8.1* 12.0-15.0 (g/dL) Final     HCT  Date Value Range Status  08/12/2011 26.2* 36.0-46.0 (%) Final    Discharge Diagnoses: Term Pregnancy-delivered  Discharge Information: Date: 08/13/2011 Activity: pelvic rest Diet: routine Medications: Ibuprofen and Percocet Condition: improved Instructions: refer to practice specific booklet Discharge to: home   Newborn Data:   Aunisty, Reali [[id number]]  Live born female  Birth Weight: 4 lb 15.5 oz (2255 g) APGAR: 8, 9   Aalyiah, Camberos [[id number]]  Live born female  Birth Weight: 5 lb 9.6 oz (2540 g) APGAR: 8, 9  Home with mother.  Kue Fox L 08/13/2011, 8:03 AM

## 2011-08-23 LAB — CBC
HCT: 29.6 — ABNORMAL LOW
HCT: 37
Hemoglobin: 12.1
Hemoglobin: 9.9 — ABNORMAL LOW
MCV: 83.2
RBC: 3.56 — ABNORMAL LOW
RBC: 4.47
WBC: 10.3
WBC: 9.5

## 2011-08-23 LAB — RPR: RPR Ser Ql: NONREACTIVE

## 2011-08-24 ENCOUNTER — Encounter (HOSPITAL_COMMUNITY): Payer: Self-pay | Admitting: *Deleted

## 2011-12-28 ENCOUNTER — Encounter (HOSPITAL_COMMUNITY): Payer: Self-pay | Admitting: *Deleted

## 2012-07-04 ENCOUNTER — Other Ambulatory Visit: Payer: Self-pay | Admitting: Internal Medicine

## 2012-07-04 DIAGNOSIS — Z1231 Encounter for screening mammogram for malignant neoplasm of breast: Secondary | ICD-10-CM

## 2012-07-25 ENCOUNTER — Ambulatory Visit
Admission: RE | Admit: 2012-07-25 | Discharge: 2012-07-25 | Disposition: A | Discharge status: Home / Self Care | Payer: BC Managed Care – PPO | Source: Ambulatory Visit | Attending: Internal Medicine | Admitting: Internal Medicine

## 2012-07-25 DIAGNOSIS — Z1231 Encounter for screening mammogram for malignant neoplasm of breast: Secondary | ICD-10-CM

## 2014-02-04 ENCOUNTER — Encounter (INDEPENDENT_AMBULATORY_CARE_PROVIDER_SITE_OTHER): Payer: Self-pay

## 2014-02-04 ENCOUNTER — Ambulatory Visit (INDEPENDENT_AMBULATORY_CARE_PROVIDER_SITE_OTHER): Payer: BC Managed Care – PPO | Admitting: Neurology

## 2014-02-04 ENCOUNTER — Encounter: Payer: Self-pay | Admitting: Neurology

## 2014-02-04 VITALS — BP 118/78 | HR 64 | Temp 98.2°F | Ht 61.5 in | Wt 151.0 lb

## 2014-02-04 DIAGNOSIS — R209 Unspecified disturbances of skin sensation: Secondary | ICD-10-CM

## 2014-02-04 DIAGNOSIS — R202 Paresthesia of skin: Secondary | ICD-10-CM

## 2014-02-04 HISTORY — DX: Paresthesia of skin: R20.2

## 2014-02-04 NOTE — Patient Instructions (Signed)
We will do some blood work today. Your exam is normal.  If you have more or other new symptoms, we will do a brain and neck MRI and consider a spinal tap if needed. I do not think we need to do more tests just yet and will monitor. No medication needed. Stay active.

## 2014-02-04 NOTE — Progress Notes (Signed)
Subjective:    Patient ID: Olivia Bailey is a 38 y.o. female.  HPI    Olivia Foley, MD, PhD [facility] [address] [address] [place], [place] [address]  Dear Dr. Renne Bailey,  I saw your patient, Olivia Bailey, upon your kind request in my neurologic clinic today for initial consultation of her leg paresthesias. The patient is unaccompanied today. As you know, Olivia Bailey is a 38 year old right-handed woman with an underlying medical history of allergic rhinitis, and otherwise benign history, who reports tingling in her legs off and on. This has been going on for the past 2 months and is intermittent in nature and she describes tingling in her anterior thighs and some below her knees, not in the back of her legs. It may last up to 5 minutes at a time and the only trigger she has noted is increase in heat, such an initially in the car. There is no worsening from posture or activity. It seems to come at any time of the day and even has woken her up from sleep one time.  Symptoms are not progressive and she denies weakness, numbness, twitching, tremors, muscle wasting and no Sx in the arms or feet or back on her legs, no B/B problems, no recurrent HAs, no diplopia, slurring of speech, visual blurriness or loss of vision. No CP/SOB, no recent illness, no F/C/N/V. Symptoms, in fact, may have become slightly less in the last 3 days.   Her Past Medical History Is Significant For: Past Medical History  Diagnosis Date  . Nausea & vomiting present    has had n/v with this pregnancy-uses Zofran prn  . Newborn product of IVF pregnancy     Her Past Surgical History Is Significant For: Past Surgical History  Procedure Laterality Date  . 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 Ectopic pregnancy surgery  2010    Right Salpingectomy  . Hysteroscopy  2011    Her Family History Is Significant For: No family history on file.  Her Social History Is Significant  For: History   Social History  . Marital Status: Married    Spouse Name: Olivia Bailey    Number of Children: Olivia Bailey  . Years of Education: Olivia Bailey   Social History Main Topics  . Smoking status: Former Games developer  . Smokeless tobacco: None  . Alcohol Use: No  . Drug Use: No  . Sexual Activity:    Other Topics Concern  . None   Social History Narrative  . None    Her Allergies Are:  Allergies  Allergen Reactions  . Penicillins Swelling    Has mouth swelling w/penicillin  :   Her Current Medications Are:  Outpatient Encounter Prescriptions as of 02/04/2014  Medication Sig  . Fe Cbn-Fe Gluc-FA-B12-C-DSS (FERRALET 90 PO) Take 1 tablet by mouth daily.    . Multiple Vitamins-Minerals (ADULT GUMMY PO) Take 1 capsule by mouth daily. OTC Gummy vitamin [employer] brand   :   Review of Systems:  Out of a complete 14 point review of systems, all are reviewed and negative with the exception of these symptoms as listed below:  Review of Systems  Constitutional: Negative.   HENT: Negative.   Eyes: Negative.   Respiratory: Negative.   Cardiovascular: Negative.   Gastrointestinal: Negative.   Endocrine: Negative.   Genitourinary: Negative.   Musculoskeletal: Negative.   Skin: Negative.   Allergic/Immunologic: Negative.   Neurological: Negative.   Hematological: Negative.  Psychiatric/Behavioral: Negative.   All other systems reviewed and are negative.    Objective:  Neurologic Exam  Physical Exam Physical Examination:   Filed Vitals:   02/04/14 1400  BP: 118/78  Pulse: 64  Temp: 98.2 F (36.8 C)    General Examination: The patient is a very pleasant 38 y.o. female in no acute distress. She appears well-developed and well-nourished and well groomed.   HEENT: Normocephalic, atraumatic, pupils are equal, round and reactive to light and accommodation. Funduscopic exam is normal with sharp disc margins noted. Extraocular tracking is good without limitation to gaze excursion or nystagmus  noted. Normal smooth pursuit is noted. Hearing is grossly intact. Tympanic membranes are clear bilaterally. Face is symmetric with normal facial animation and normal facial sensation. Speech is clear with no dysarthria noted. There is no hypophonia. There is no lip, neck/head, jaw or voice tremor. Neck is supple with full range of passive and active motion. There are no carotid bruits on auscultation. Oropharynx exam reveals: mild mouth dryness, good dental hygiene and mild airway crowding, due to long tongue. Mallampati is class I. Tongue protrudes centrally and palate elevates symmetrically. Tonsils are small.    Chest: Clear to auscultation without wheezing, rhonchi or crackles noted.  Heart: S1+S2+0, regular and normal without murmurs, rubs or gallops noted.   Abdomen: Soft, non-tender and non-distended with normal bowel sounds appreciated on auscultation.  Extremities: There is no pitting edema in the distal lower extremities bilaterally. Pedal pulses are intact.  Skin: Warm and dry without trophic changes noted. There are no varicose veins.  Musculoskeletal: exam reveals no obvious joint deformities, tenderness or joint swelling or erythema.   Neurologically:  Mental status: The patient is awake, alert and oriented in all 4 spheres. Her immediate and remote memory, attention, language skills and fund of knowledge are appropriate. There is no evidence of aphasia, agnosia, apraxia or anomia. Speech is clear with normal prosody and enunciation. Thought process is linear. Mood is normal and affect is normal.  Cranial nerves II - XII are as described above under HEENT exam. In addition: shoulder shrug is normal with equal shoulder height noted. Motor exam: Normal bulk, strength and tone is noted. No fasciculations, myoclonus, no wasting noted. There is no drift, tremor or rebound. Romberg is negative. Reflexes are 2+ throughout. Babinski: Toes are flexor bilaterally. Fine motor skills and  coordination: intact with normal finger taps, normal hand movements, normal rapid alternating patting, normal foot taps and normal foot agility.  Cerebellar testing: No dysmetria or intention tremor on finger to nose testing. Heel to shin is unremarkable bilaterally. There is no truncal or gait ataxia.  Sensory exam: intact to light touch, pinprick, vibration, temperature sense proprioception in the upper and lower extremities.  Gait, station and balance: She stands easily. No veering to one side is noted. No leaning to one side is noted. Posture is age-appropriate and stance is narrow based. Gait shows normal stride length and normal pace. No problems turning are noted. She turns en bloc. Tandem walk is unremarkable. Intact toe and heel stance is noted.               Assessment and Plan:   In summary, Olivia Bailey is a very pleasant 38 y.o.-year old female with an underlying medical history of allergic rhinitis, and otherwise benign history, who reports tingling in her legs off and on for the past 2 months, with slight improvement in the last few days reported. Her physical exam is  non-focal. She is doing fairly well at this time and I reassured the patient in that regard. Her nonfocal exam and lack of symptoms at this time do not warrant any imaging tests at this time. I talked to her about potentially doing a brain and neck MRI with and without contrast if symptoms were to worsen or if she were to have any other new symptoms. She asked me about having MS. I explained to her that the presentation for him as is so variable that there is really no way to exclude and as on the basis of her symptomatology. Nevertheless, the very intermittent nature of her symptoms and the distribution over just the front of her thighs and the front of her legs below the knees, sparing her feet, lateral legs and posterior legs makes it somewhat difficult to localize her symptoms. At this juncture, suggested that we proceed  with some blood work and we will call her with the test results. I told her that she does need any new medication at this time. I suggested that she continue to stay active and pursue a healthy lifestyle in general. I answered all her questions today and the patient was in agreement with the above outlined plan. I would like to see the patient back on an as-needed basis. We will call her with her blood test results.  Thank you very much for allowing me to participate in the care of this nice patient. If I can be of any further assistance to you please do not hesitate to call me at [phone].  Sincerely,   Olivia FoleySaima April Colter, MD, PhD

## 2014-02-05 LAB — SEDIMENTATION RATE: SED RATE: 5 mm/h (ref 0–32)

## 2014-02-05 LAB — COMPREHENSIVE METABOLIC PANEL
ALBUMIN: 4.4 g/dL (ref 3.5–5.5)
ALK PHOS: 41 IU/L (ref 39–117)
ALT: 6 IU/L (ref 0–32)
AST: 14 IU/L (ref 0–40)
Albumin/Globulin Ratio: 1.8 (ref 1.1–2.5)
BUN / CREAT RATIO: 10 (ref 8–20)
BUN: 8 mg/dL (ref 6–20)
CHLORIDE: 102 mmol/L (ref 97–108)
CO2: 23 mmol/L (ref 18–29)
Calcium: 9 mg/dL (ref 8.7–10.2)
Creatinine, Ser: 0.77 mg/dL (ref 0.57–1.00)
GFR calc Af Amer: 115 mL/min/{1.73_m2} (ref >59)
GFR calc non Af Amer: 100 mL/min/{1.73_m2} (ref >59)
GLUCOSE: 77 mg/dL (ref 65–99)
Globulin, Total: 2.5 g/dL (ref 1.5–4.5)
Potassium: 3.8 mmol/L (ref 3.5–5.2)
Sodium: 140 mmol/L (ref 134–144)
TOTAL PROTEIN: 6.9 g/dL (ref 6.0–8.5)
Total Bilirubin: 0.3 mg/dL (ref 0.0–1.2)

## 2014-02-05 LAB — HGB A1C W/O EAG: Hgb A1c MFr Bld: 5.6 % (ref 4.8–5.6)

## 2014-02-05 LAB — B12 AND FOLATE PANEL
FOLATE: 17.4 ng/mL (ref >3.0)
Vitamin B-12: 776 pg/mL (ref 211–946)

## 2014-02-05 LAB — VITAMIN D 25 HYDROXY (VIT D DEFICIENCY, FRACTURES): Vit D, 25-Hydroxy: 10.8 ng/mL — ABNORMAL LOW (ref 30.0–100.0)

## 2014-02-05 LAB — ANA W/REFLEX: Anti Nuclear Antibody(ANA): NEGATIVE

## 2014-02-05 LAB — TSH: TSH: 1.74 u[IU]/mL (ref 0.450–4.500)

## 2014-02-05 LAB — C-REACTIVE PROTEIN: CRP: 1.9 mg/L (ref 0.0–4.9)

## 2014-02-05 LAB — RPR: RPR: NONREACTIVE

## 2014-02-05 LAB — RHEUMATOID FACTOR

## 2014-02-05 NOTE — Progress Notes (Signed)
Quick Note:  Please call patient regarding her recent blood work. Everything looks good which includes vitamin B12, diabetes marker thyroid screen, inflammatory markers and autoimmune markers, electrolytes, kidney function, liver function. The only value that was low was her vitamin D level. For this she can start taking an over-the-counter vitamin D supplement. No specific brand is necessary. She can have her primary care physician recheck her vitamin D level in 3-6 months after starting a supplement. No other action is required. Huston FoleySaima Aliza Moret, MD, PhD [facility]  ______

## 2014-09-08 ENCOUNTER — Encounter: Payer: Self-pay | Admitting: Neurology

## 2014-10-08 ENCOUNTER — Other Ambulatory Visit: Payer: Self-pay

## 2014-10-10 LAB — CYTOLOGY - PAP

## 2015-05-05 ENCOUNTER — Other Ambulatory Visit: Payer: Self-pay

## 2015-05-07 LAB — CYTOLOGY - PAP

## 2015-10-24 ENCOUNTER — Encounter (HOSPITAL_COMMUNITY): Payer: Self-pay | Admitting: Emergency Medicine

## 2015-10-24 ENCOUNTER — Emergency Department (INDEPENDENT_AMBULATORY_CARE_PROVIDER_SITE_OTHER): Payer: BLUE CROSS/BLUE SHIELD

## 2015-10-24 ENCOUNTER — Emergency Department (INDEPENDENT_AMBULATORY_CARE_PROVIDER_SITE_OTHER)
Admission: EM | Admit: 2015-10-24 | Discharge: 2015-10-24 | Disposition: A | Discharge status: Home / Self Care | Payer: BLUE CROSS/BLUE SHIELD | Source: Home / Self Care | Attending: Emergency Medicine | Admitting: Emergency Medicine

## 2015-10-24 DIAGNOSIS — S93401A Sprain of unspecified ligament of right ankle, initial encounter: Secondary | ICD-10-CM

## 2015-10-24 DIAGNOSIS — W19XXXA Unspecified fall, initial encounter: Secondary | ICD-10-CM | POA: Diagnosis not present

## 2015-10-24 DIAGNOSIS — M25571 Pain in right ankle and joints of right foot: Secondary | ICD-10-CM

## 2015-10-24 MED ORDER — IBUPROFEN 800 MG PO TABS: 800.0000 mg | ORAL_TABLET | Freq: Three times a day (TID) | ORAL | Status: AC

## 2015-10-24 NOTE — Discharge Instructions (Signed)
Ankle Sprain An ankle sprain is an injury to the strong, fibrous tissues (ligaments) that hold your ankle bones together.  HOME CARE   Put ice on your ankle for 1-2 days or as told by your doctor.  Put ice in a plastic bag.  Place a towel between your skin and the bag.  Leave the ice on for 15-20 minutes at a time, every 2 hours while you are awake.  Only take medicine as told by your doctor.  Raise (elevate) your injured ankle above the level of your heart as much as possible for 2-3 days.  Use crutches if your doctor tells you to. Slowly put your own weight on the affected ankle. Use the crutches until you can walk without pain.  If you have a plaster splint:  Do not rest it on anything harder than a pillow for 24 hours.  Do not put weight on it.  Do not get it wet.  Take it off to shower or bathe.  If given, use an elastic wrap or support stocking for support. Take the wrap off if your toes lose feeling (numb), tingle, or turn cold or blue.  If you have an air splint:  Add or let out air to make it comfortable.  Take it off at night and to shower and bathe.  Wiggle your toes and move your ankle up and down often while you are wearing it. GET HELP IF:  You have rapidly increasing bruising or puffiness (swelling).  Your toes feel very cold.  You lose feeling in your foot.  Your medicine does not help your pain. GET HELP RIGHT AWAY IF:   Your toes lose feeling (numb) or turn blue.  You have severe pain that is increasing. MAKE SURE YOU:   Understand these instructions.  Will watch your condition.  Will get help right away if you are not doing well or get worse.   This information is not intended to replace advice given to you by your health care provider. Make sure you discuss any questions you have with your health care provider.   Weight bear as tolerated, ice and rest. Ankle support when up. May use Ibuprofen as needed. Great to see you!    Document  Released: 04/11/2008 Document Revised: 11/14/2014 Document Reviewed: 05/07/2012 Elsevier Interactive Patient Education [employer]2016 [employer]

## 2015-10-24 NOTE — ED Provider Notes (Signed)
CSN: [id number]     Arrival date & time 10/24/15  1848 History   First MD Initiated Contact with Patient 10/24/15 1935     Chief Complaint  Patient presents with  . Fall  . Ankle Pain   (Consider location/radiation/quality/duration/timing/severity/associated sxs/prior Treatment) HPI Comments: Patient presents with right ankle pain secondary to injury earlier today. She stepped off a curb while holding her 38 yo daughter and twisted on a curb and fell to the ground. Pain is lateral with mild swelling. Bearing weight minimally.   Patient is a 39 y.o. female presenting with fall and ankle pain. The history is provided by the patient.  Fall  Ankle Pain   Past Medical History  Diagnosis Date  . Nausea & vomiting present    has had n/v with this pregnancy-uses Zofran prn  . Newborn product of IVF pregnancy   . Paresthesias 02/04/2014   Past Surgical History  Procedure Laterality Date  . 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 Ectopic pregnancy surgery  2010    Right Salpingectomy  . Hysteroscopy  2011   History reviewed. No pertinent family history. Social History  Substance Use Topics  . Smoking status: Former Games developer  . Smokeless tobacco: None  . Alcohol Use: No   OB History    Gravida Para Term Preterm AB TAB SAB Ectopic Multiple Living   0 Obstetric Comments   OB history updated with review of prenatal record.     Review of Systems  All other systems reviewed and are negative.   Allergies  Penicillins  Home Medications   Prior to Admission medications   Medication Sig Start Date End Date Taking? Authorizing Provider  Fe Cbn-Fe Gluc-FA-B12-C-DSS (FERRALET 90 PO) Take 1 tablet by mouth daily.      Historical Provider, MD  ibuprofen (ADVIL,MOTRIN) 800 MG tablet Take 1 tablet (800 mg total) by mouth 3 (three) times daily. 10/24/15   Riki Sheer, PA-C  Multiple Vitamins-Minerals (ADULT GUMMY PO) Take 1 capsule by mouth daily. OTC Gummy  vitamin Target brand     Historical Provider, MD   Meds Ordered and Administered this Visit  Medications - No data to display  BP 133/84 mmHg  Pulse 77  Temp(Src) 98.7 F (37.1 C) (Oral)  Resp 18  SpO2 100%  LMP 10/17/2015 (Exact Date) No data found.   Physical Exam  Constitutional: She is oriented to person, place, and time. She appears well-developed and well-nourished. No distress.  HENT:  Head: Normocephalic and atraumatic.  Cardiovascular: Intact distal pulses.   Pulmonary/Chest: Effort normal.  Musculoskeletal: She exhibits edema and tenderness.  Mild swelling to lateral ankle with pain to palpation, decreased flexion and extension of ankle. No ecchymosis noted. Sensation intact  Neurological: She is alert and oriented to person, place, and time.  Skin: Skin is warm and dry. No rash noted. She is not diaphoretic.  Psychiatric: Her behavior is normal.  Nursing note and vitals reviewed.   ED Course  Procedures (including critical care time)  Labs Review Labs Reviewed - No data to display  Imaging Review Dg Ankle Complete Right  10/24/2015  CLINICAL DATA:  39 year old female with a history of fall. Ankle injury. EXAM: RIGHT ANKLE - COMPLETE 3+ VIEW COMPARISON:  None. FINDINGS: No acute fracture displaced identified. Mild soft tissue swelling on the lateral ankle. No radiopaque foreign body. No significant degenerative changes. Ankle  mortise is congruent. IMPRESSION: Negative for acute bony abnormality. Mild soft tissue swelling on the lateral ankle. Signed, Yvone NeuJaime S. Loreta AveWagner, DO [facility] Electronically Signed   By: Gilmer MorJaime  Wagner D.O.   On: 10/24/2015 19:50     Visual Acuity Review  Right Eye Distance:   Left Eye Distance:   Bilateral Distance:    Right Eye Near:   Left Eye Near:    Bilateral Near:         MDM   1. Ankle sprain, right, initial encounter   2. Ankle pain, right    No fracture.  Treat with ASO, rest, ice and NSAIDs. Work note given if it is needed. F/U with Orthopedic if this is not improving.     Riki SheerMichelle G Marqueta Pulley, PA-C 10/24/15 2025

## 2015-10-24 NOTE — ED Notes (Signed)
[facility] a [facility] of a right ankle injury secondary to a fall that occurred today. The patient stated that she stepped off of a curb and fell to the ground injuring her right ankle.

## 2017-07-11 DIAGNOSIS — E559 Vitamin D deficiency, unspecified: Secondary | ICD-10-CM | POA: Diagnosis not present

## 2017-07-11 DIAGNOSIS — Z0000 Encounter for general adult medical examination without abnormal findings: Secondary | ICD-10-CM | POA: Diagnosis not present

## 2017-07-18 ENCOUNTER — Other Ambulatory Visit: Payer: Self-pay | Admitting: Internal Medicine

## 2017-07-18 DIAGNOSIS — Z23 Encounter for immunization: Secondary | ICD-10-CM | POA: Diagnosis not present

## 2017-07-18 DIAGNOSIS — N6310 Unspecified lump in the right breast, unspecified quadrant: Secondary | ICD-10-CM

## 2017-07-18 DIAGNOSIS — Z0000 Encounter for general adult medical examination without abnormal findings: Secondary | ICD-10-CM | POA: Diagnosis not present

## 2017-07-19 ENCOUNTER — Ambulatory Visit
Admission: RE | Admit: 2017-07-19 | Discharge: 2017-07-19 | Disposition: A | Discharge status: Home / Self Care | Payer: BLUE CROSS/BLUE SHIELD | Source: Ambulatory Visit | Attending: Internal Medicine | Admitting: Internal Medicine

## 2017-07-19 ENCOUNTER — Other Ambulatory Visit: Payer: Self-pay | Admitting: Internal Medicine

## 2017-07-19 DIAGNOSIS — R2231 Localized swelling, mass and lump, right upper limb: Secondary | ICD-10-CM

## 2017-07-19 DIAGNOSIS — N6310 Unspecified lump in the right breast, unspecified quadrant: Secondary | ICD-10-CM

## 2017-07-19 DIAGNOSIS — R922 Inconclusive mammogram: Secondary | ICD-10-CM | POA: Diagnosis not present

## 2017-08-22 DIAGNOSIS — Z1212 Encounter for screening for malignant neoplasm of rectum: Secondary | ICD-10-CM | POA: Diagnosis not present

## 2017-08-22 DIAGNOSIS — Z6829 Body mass index (BMI) 29.0-29.9, adult: Secondary | ICD-10-CM | POA: Diagnosis not present

## 2017-08-22 DIAGNOSIS — Z01419 Encounter for gynecological examination (general) (routine) without abnormal findings: Secondary | ICD-10-CM | POA: Diagnosis not present

## 2018-07-20 DIAGNOSIS — E559 Vitamin D deficiency, unspecified: Secondary | ICD-10-CM | POA: Diagnosis not present

## 2018-07-20 DIAGNOSIS — Z0000 Encounter for general adult medical examination without abnormal findings: Secondary | ICD-10-CM | POA: Diagnosis not present

## 2018-07-20 DIAGNOSIS — K219 Gastro-esophageal reflux disease without esophagitis: Secondary | ICD-10-CM | POA: Diagnosis not present

## 2018-07-27 DIAGNOSIS — Z0000 Encounter for general adult medical examination without abnormal findings: Secondary | ICD-10-CM | POA: Diagnosis not present

## 2018-07-27 DIAGNOSIS — Z23 Encounter for immunization: Secondary | ICD-10-CM | POA: Diagnosis not present

## 2018-09-05 DIAGNOSIS — Z01419 Encounter for gynecological examination (general) (routine) without abnormal findings: Secondary | ICD-10-CM | POA: Diagnosis not present

## 2018-09-05 DIAGNOSIS — Z1212 Encounter for screening for malignant neoplasm of rectum: Secondary | ICD-10-CM | POA: Diagnosis not present

## 2018-09-05 DIAGNOSIS — Z6827 Body mass index (BMI) 27.0-27.9, adult: Secondary | ICD-10-CM | POA: Diagnosis not present

## 2018-11-29 DIAGNOSIS — Z808 Family history of malignant neoplasm of other organs or systems: Secondary | ICD-10-CM | POA: Diagnosis not present

## 2018-11-29 DIAGNOSIS — Z800 Family history of malignant neoplasm of digestive organs: Secondary | ICD-10-CM | POA: Diagnosis not present

## 2018-11-29 DIAGNOSIS — Z803 Family history of malignant neoplasm of breast: Secondary | ICD-10-CM | POA: Diagnosis not present

## 2018-11-29 DIAGNOSIS — Z806 Family history of leukemia: Secondary | ICD-10-CM | POA: Diagnosis not present

## 2019-01-03 DIAGNOSIS — F439 Reaction to severe stress, unspecified: Secondary | ICD-10-CM | POA: Diagnosis not present

## 2019-01-03 DIAGNOSIS — K219 Gastro-esophageal reflux disease without esophagitis: Secondary | ICD-10-CM | POA: Diagnosis not present

## 2019-01-03 DIAGNOSIS — R079 Chest pain, unspecified: Secondary | ICD-10-CM | POA: Diagnosis not present

## 2019-01-09 DIAGNOSIS — Z809 Family history of malignant neoplasm, unspecified: Secondary | ICD-10-CM | POA: Diagnosis not present

## 2019-07-22 DIAGNOSIS — E559 Vitamin D deficiency, unspecified: Secondary | ICD-10-CM | POA: Diagnosis not present

## 2019-07-22 DIAGNOSIS — Z0000 Encounter for general adult medical examination without abnormal findings: Secondary | ICD-10-CM | POA: Diagnosis not present

## 2019-07-24 DIAGNOSIS — Z0000 Encounter for general adult medical examination without abnormal findings: Secondary | ICD-10-CM | POA: Diagnosis not present

## 2019-08-20 DIAGNOSIS — K625 Hemorrhage of anus and rectum: Secondary | ICD-10-CM | POA: Diagnosis not present

## 2019-08-20 DIAGNOSIS — Z1211 Encounter for screening for malignant neoplasm of colon: Secondary | ICD-10-CM | POA: Diagnosis not present

## 2019-08-20 DIAGNOSIS — K5904 Chronic idiopathic constipation: Secondary | ICD-10-CM | POA: Diagnosis not present

## 2019-08-20 DIAGNOSIS — K219 Gastro-esophageal reflux disease without esophagitis: Secondary | ICD-10-CM | POA: Diagnosis not present

## 2019-09-04 DIAGNOSIS — M12811 Other specific arthropathies, not elsewhere classified, right shoulder: Secondary | ICD-10-CM | POA: Diagnosis not present

## 2019-10-22 DIAGNOSIS — Z01419 Encounter for gynecological examination (general) (routine) without abnormal findings: Secondary | ICD-10-CM | POA: Diagnosis not present

## 2019-10-22 DIAGNOSIS — Z1231 Encounter for screening mammogram for malignant neoplasm of breast: Secondary | ICD-10-CM | POA: Diagnosis not present

## 2019-10-22 DIAGNOSIS — Z6825 Body mass index (BMI) 25.0-25.9, adult: Secondary | ICD-10-CM | POA: Diagnosis not present

## 2019-11-15 DIAGNOSIS — K625 Hemorrhage of anus and rectum: Secondary | ICD-10-CM | POA: Diagnosis not present

## 2019-11-15 DIAGNOSIS — K5904 Chronic idiopathic constipation: Secondary | ICD-10-CM | POA: Diagnosis not present

## 2019-11-15 DIAGNOSIS — Z1211 Encounter for screening for malignant neoplasm of colon: Secondary | ICD-10-CM | POA: Diagnosis not present

## 2019-11-15 DIAGNOSIS — Z8371 Family history of colonic polyps: Secondary | ICD-10-CM | POA: Diagnosis not present

## 2020-07-28 DIAGNOSIS — E559 Vitamin D deficiency, unspecified: Secondary | ICD-10-CM | POA: Diagnosis not present

## 2020-07-28 DIAGNOSIS — Z0000 Encounter for general adult medical examination without abnormal findings: Secondary | ICD-10-CM | POA: Diagnosis not present

## 2020-07-28 DIAGNOSIS — Z13220 Encounter for screening for lipoid disorders: Secondary | ICD-10-CM | POA: Diagnosis not present

## 2020-07-30 DIAGNOSIS — E559 Vitamin D deficiency, unspecified: Secondary | ICD-10-CM | POA: Diagnosis not present

## 2020-07-30 DIAGNOSIS — Z23 Encounter for immunization: Secondary | ICD-10-CM | POA: Diagnosis not present

## 2020-07-30 DIAGNOSIS — Z0000 Encounter for general adult medical examination without abnormal findings: Secondary | ICD-10-CM | POA: Diagnosis not present

## 2020-08-03 DIAGNOSIS — L700 Acne vulgaris: Secondary | ICD-10-CM | POA: Diagnosis not present

## 2020-09-02 DIAGNOSIS — Z79899 Other long term (current) drug therapy: Secondary | ICD-10-CM | POA: Diagnosis not present

## 2020-10-27 DIAGNOSIS — L700 Acne vulgaris: Secondary | ICD-10-CM | POA: Diagnosis not present

## 2020-10-27 DIAGNOSIS — Z79899 Other long term (current) drug therapy: Secondary | ICD-10-CM | POA: Diagnosis not present

## 2020-11-30 DIAGNOSIS — Z79899 Other long term (current) drug therapy: Secondary | ICD-10-CM | POA: Diagnosis not present

## 2020-12-10 ENCOUNTER — Other Ambulatory Visit: Payer: Self-pay | Admitting: Obstetrics and Gynecology

## 2020-12-10 DIAGNOSIS — Z6826 Body mass index (BMI) 26.0-26.9, adult: Secondary | ICD-10-CM | POA: Diagnosis not present

## 2020-12-10 DIAGNOSIS — N6310 Unspecified lump in the right breast, unspecified quadrant: Secondary | ICD-10-CM | POA: Diagnosis not present

## 2020-12-10 DIAGNOSIS — N6331 Unspecified lump in axillary tail of the right breast: Secondary | ICD-10-CM

## 2020-12-10 DIAGNOSIS — Z01419 Encounter for gynecological examination (general) (routine) without abnormal findings: Secondary | ICD-10-CM | POA: Diagnosis not present

## 2020-12-10 DIAGNOSIS — Z23 Encounter for immunization: Secondary | ICD-10-CM | POA: Diagnosis not present

## 2021-01-22 ENCOUNTER — Other Ambulatory Visit: Payer: Self-pay | Admitting: Obstetrics and Gynecology

## 2021-01-22 ENCOUNTER — Ambulatory Visit
Admission: RE | Admit: 2021-01-22 | Discharge: 2021-01-22 | Disposition: A | Discharge status: Home / Self Care | Payer: BC Managed Care – PPO | Source: Ambulatory Visit | Attending: Obstetrics and Gynecology | Admitting: Obstetrics and Gynecology

## 2021-01-22 ENCOUNTER — Ambulatory Visit
Admission: RE | Admit: 2021-01-22 | Discharge: 2021-01-22 | Disposition: A | Discharge status: Home / Self Care | Payer: BLUE CROSS/BLUE SHIELD | Source: Ambulatory Visit | Attending: Obstetrics and Gynecology | Admitting: Obstetrics and Gynecology

## 2021-01-22 ENCOUNTER — Other Ambulatory Visit: Payer: Self-pay

## 2021-01-22 DIAGNOSIS — N6331 Unspecified lump in axillary tail of the right breast: Secondary | ICD-10-CM

## 2021-01-22 DIAGNOSIS — N644 Mastodynia: Secondary | ICD-10-CM | POA: Diagnosis not present

## 2021-02-01 DIAGNOSIS — L700 Acne vulgaris: Secondary | ICD-10-CM | POA: Diagnosis not present

## 2021-02-10 DIAGNOSIS — Z23 Encounter for immunization: Secondary | ICD-10-CM | POA: Diagnosis not present

## 2021-06-14 DIAGNOSIS — Z23 Encounter for immunization: Secondary | ICD-10-CM | POA: Diagnosis not present

## 2021-07-30 ENCOUNTER — Ambulatory Visit
Admission: RE | Admit: 2021-07-30 | Discharge: 2021-07-30 | Disposition: A | Discharge status: Home / Self Care | Payer: BC Managed Care – PPO | Source: Ambulatory Visit | Attending: Obstetrics and Gynecology | Admitting: Obstetrics and Gynecology

## 2021-07-30 ENCOUNTER — Other Ambulatory Visit: Payer: Self-pay

## 2021-07-30 ENCOUNTER — Other Ambulatory Visit: Payer: Self-pay | Admitting: Obstetrics and Gynecology

## 2021-07-30 DIAGNOSIS — N6331 Unspecified lump in axillary tail of the right breast: Secondary | ICD-10-CM

## 2021-07-30 DIAGNOSIS — R2231 Localized swelling, mass and lump, right upper limb: Secondary | ICD-10-CM | POA: Diagnosis not present

## 2021-08-03 DIAGNOSIS — E559 Vitamin D deficiency, unspecified: Secondary | ICD-10-CM | POA: Diagnosis not present

## 2021-08-03 DIAGNOSIS — Z0000 Encounter for general adult medical examination without abnormal findings: Secondary | ICD-10-CM | POA: Diagnosis not present

## 2021-08-06 DIAGNOSIS — M79642 Pain in left hand: Secondary | ICD-10-CM | POA: Diagnosis not present

## 2021-08-06 DIAGNOSIS — K219 Gastro-esophageal reflux disease without esophagitis: Secondary | ICD-10-CM | POA: Diagnosis not present

## 2021-08-06 DIAGNOSIS — Z0000 Encounter for general adult medical examination without abnormal findings: Secondary | ICD-10-CM | POA: Diagnosis not present

## 2021-08-06 DIAGNOSIS — Z23 Encounter for immunization: Secondary | ICD-10-CM | POA: Diagnosis not present

## 2021-08-10 DIAGNOSIS — S62647A Nondisplaced fracture of proximal phalanx of left little finger, initial encounter for closed fracture: Secondary | ICD-10-CM | POA: Diagnosis not present

## 2022-01-28 ENCOUNTER — Ambulatory Visit
Admission: RE | Admit: 2022-01-28 | Discharge: 2022-01-28 | Disposition: A | Discharge status: Home / Self Care | Payer: BC Managed Care – PPO | Source: Ambulatory Visit | Attending: Obstetrics and Gynecology | Admitting: Obstetrics and Gynecology

## 2022-01-28 ENCOUNTER — Ambulatory Visit: Payer: BC Managed Care – PPO

## 2022-01-28 DIAGNOSIS — N6331 Unspecified lump in axillary tail of the right breast: Secondary | ICD-10-CM

## 2022-01-28 DIAGNOSIS — R922 Inconclusive mammogram: Secondary | ICD-10-CM | POA: Diagnosis not present

## 2022-01-28 DIAGNOSIS — N6489 Other specified disorders of breast: Secondary | ICD-10-CM | POA: Diagnosis not present

## 2022-03-07 DIAGNOSIS — Z6827 Body mass index (BMI) 27.0-27.9, adult: Secondary | ICD-10-CM | POA: Diagnosis not present

## 2022-03-07 DIAGNOSIS — Z01419 Encounter for gynecological examination (general) (routine) without abnormal findings: Secondary | ICD-10-CM | POA: Diagnosis not present

## 2022-07-10 IMAGING — MG DIGITAL DIAGNOSTIC BILAT W/ TOMO W/ CAD
8 series · 8 of 24 positions shown · non-contrast
Comparison: Previous exam(s).

CLINICAL DATA: 45-year-old female for 1 year follow-up of RIGHT
axillary mass, and for annual bilateral mammogram

EXAM:
DIGITAL DIAGNOSTIC BILATERAL MAMMOGRAM WITH TOMOSYNTHESIS AND CAD;
US AXILLARY RIGHT
TECHNIQUE: Bilateral digital diagnostic mammography and breast tomosynthesis
was performed. The images were evaluated with computer-aided
detection.; Targeted ultrasound examination of the right axilla was
performed.

[L CC synth-2D]
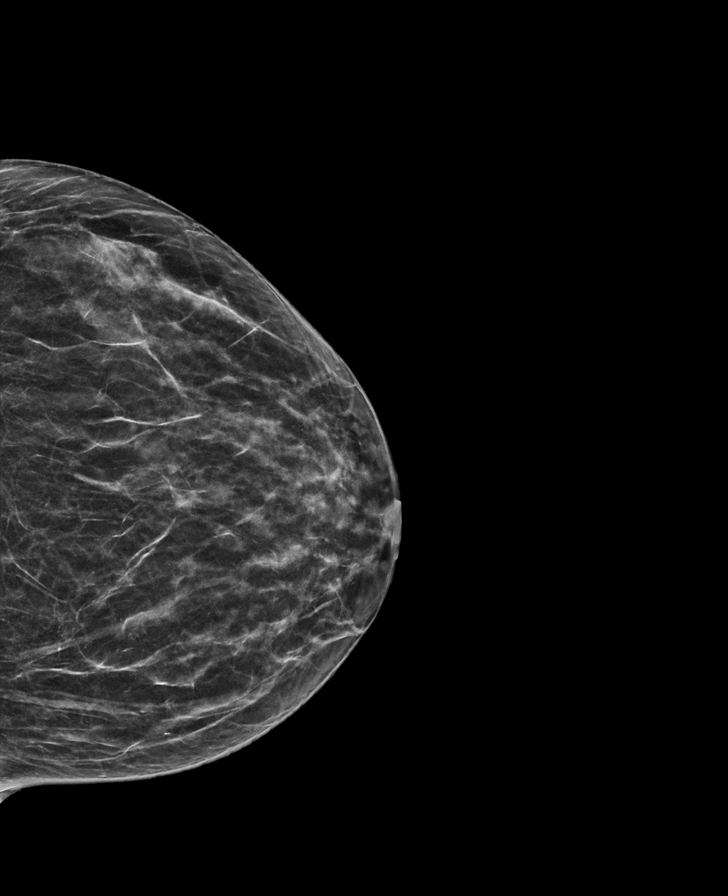

[L MLO synth-2D]
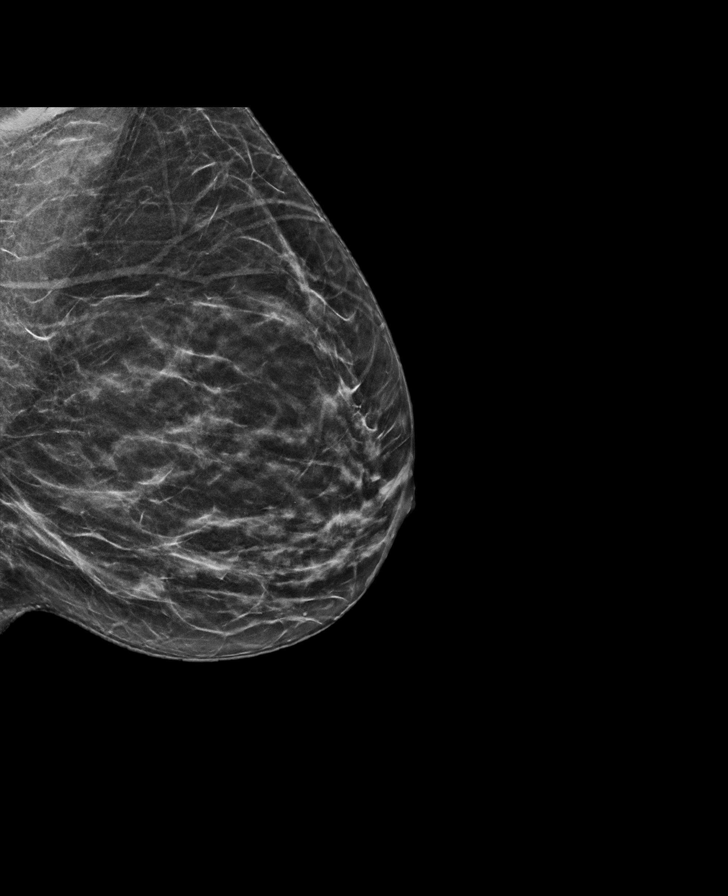

[R MLO synth-2D]
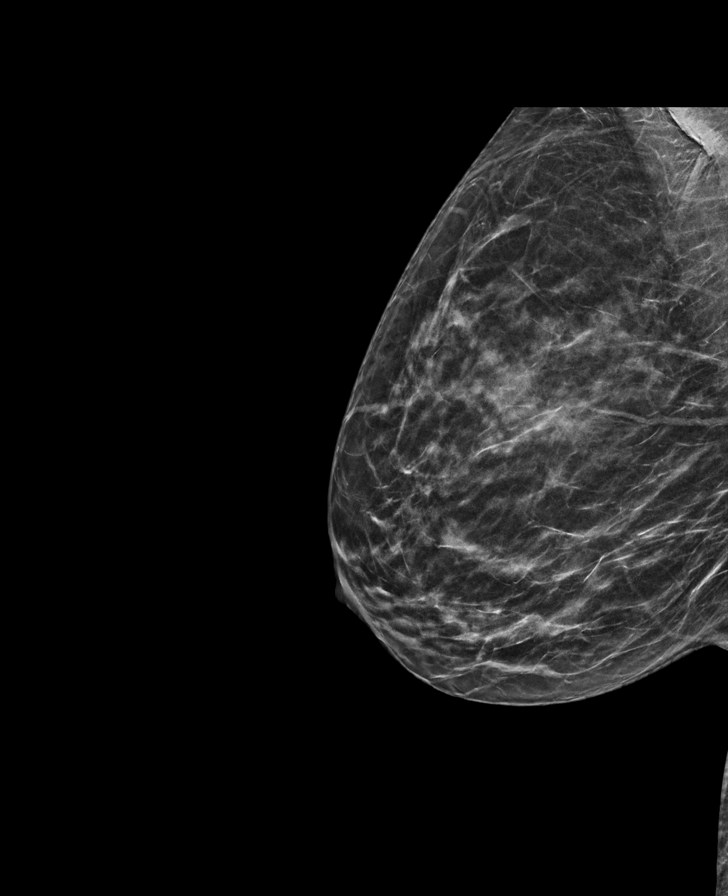

[R CC synth-2D]
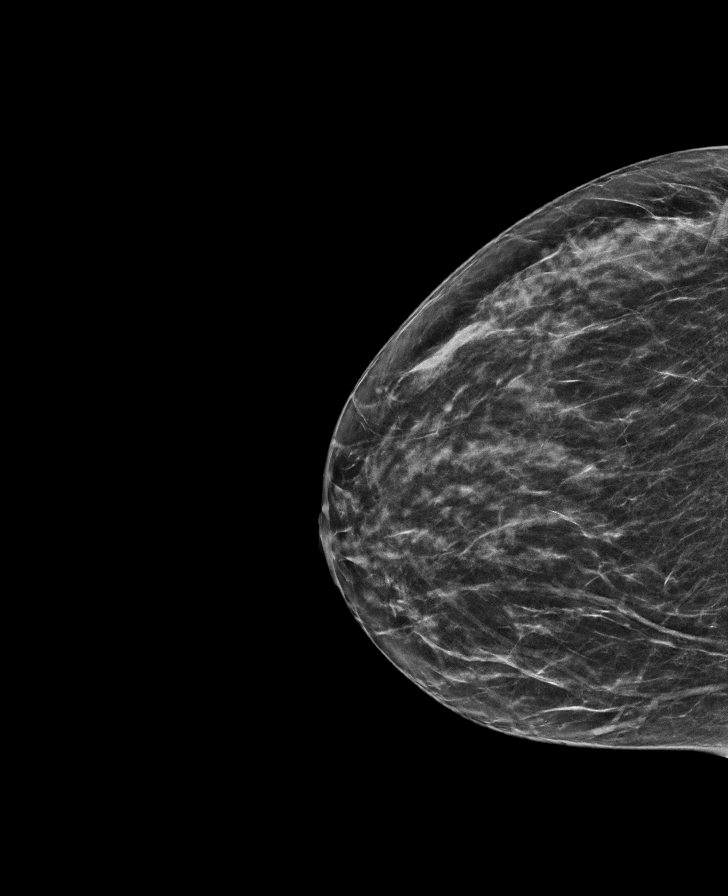

[R MLO tomo · tomo slice 27/53.0]
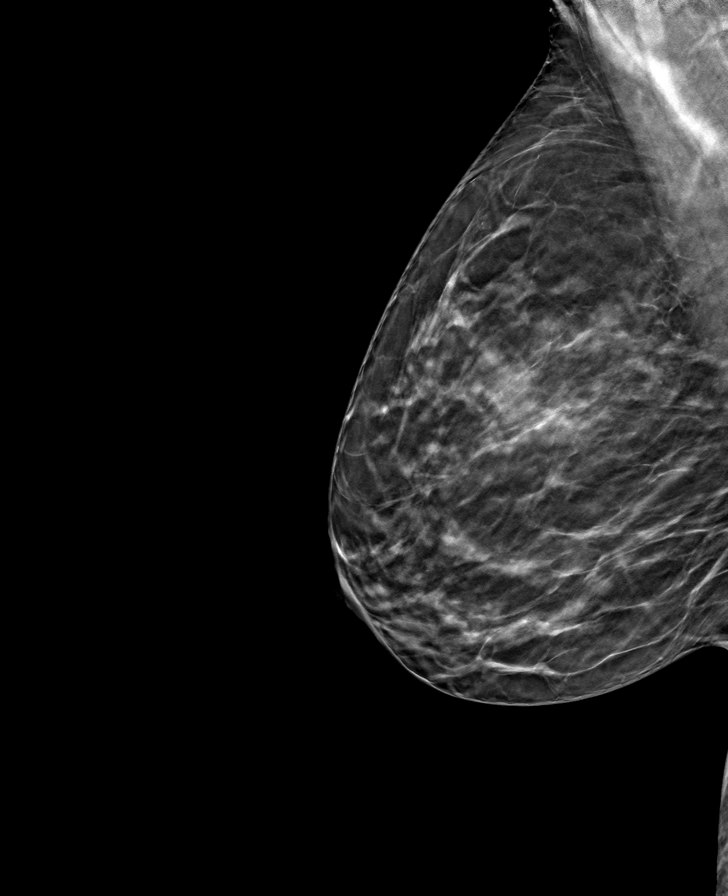

[L CC tomo · tomo slice 26/51.0]
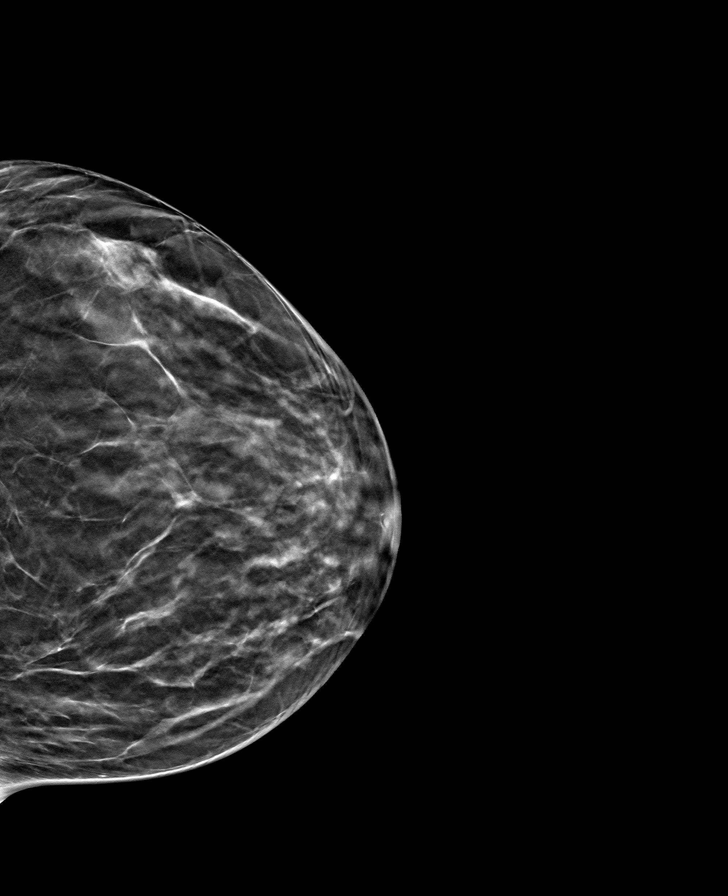

[R CC tomo · tomo slice 27/54.0]
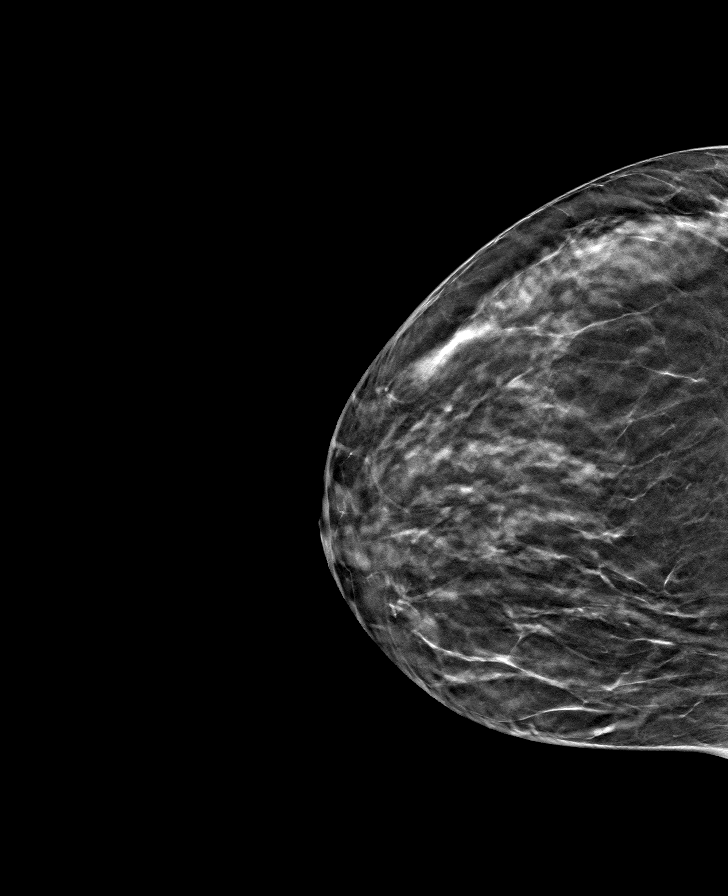

[L MLO tomo · tomo slice 27/54.0]
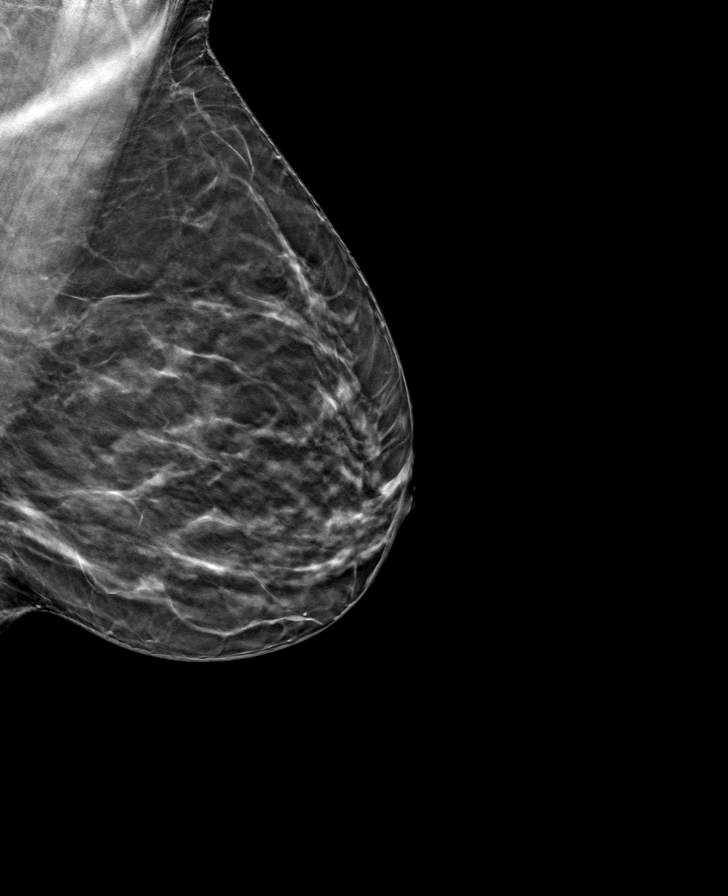

[8 of 24 positions shown; findings below may reference images not displayed]

ACR Breast Density Category b: There are scattered areas of
fibroglandular density.
FINDINGS: 2D/3D full field views of both breasts demonstrate no suspicious
mass, distortion or worrisome calcifications.

Targeted ultrasound is performed, showing a stable 0.3 x 0.2 x
cm circumscribed oval hypoechoic mass in the RIGHT axilla. No new
sonographic abnormalities in the RIGHT axilla noted.
IMPRESSION: 1. Stable likely benign 0.3 cm RIGHT axillary mass. One year
follow-up recommended to ensure 2 year stability.
2. No mammographic evidence of breast malignancy.

RECOMMENDATION:
Bilateral diagnostic mammogram and RIGHT axillary ultrasound in 1
year.

I have discussed the findings and recommendations with the patient.
If applicable, a reminder letter will be sent to the patient
regarding the next appointment.

BI-RADS CATEGORY  3: Probably benign.

## 2022-09-09 DIAGNOSIS — Z0000 Encounter for general adult medical examination without abnormal findings: Secondary | ICD-10-CM | POA: Diagnosis not present

## 2022-09-09 DIAGNOSIS — E559 Vitamin D deficiency, unspecified: Secondary | ICD-10-CM | POA: Diagnosis not present

## 2022-09-13 DIAGNOSIS — K219 Gastro-esophageal reflux disease without esophagitis: Secondary | ICD-10-CM | POA: Diagnosis not present

## 2022-09-13 DIAGNOSIS — Z23 Encounter for immunization: Secondary | ICD-10-CM | POA: Diagnosis not present

## 2022-09-13 DIAGNOSIS — M12811 Other specific arthropathies, not elsewhere classified, right shoulder: Secondary | ICD-10-CM | POA: Diagnosis not present

## 2022-09-13 DIAGNOSIS — Z0000 Encounter for general adult medical examination without abnormal findings: Secondary | ICD-10-CM | POA: Diagnosis not present

## 2022-09-13 DIAGNOSIS — E559 Vitamin D deficiency, unspecified: Secondary | ICD-10-CM | POA: Diagnosis not present

## 2023-03-02 DIAGNOSIS — L700 Acne vulgaris: Secondary | ICD-10-CM | POA: Diagnosis not present

## 2023-03-02 DIAGNOSIS — Z79899 Other long term (current) drug therapy: Secondary | ICD-10-CM | POA: Diagnosis not present

## 2023-03-31 DIAGNOSIS — L700 Acne vulgaris: Secondary | ICD-10-CM | POA: Diagnosis not present

## 2023-05-05 DIAGNOSIS — Z01419 Encounter for gynecological examination (general) (routine) without abnormal findings: Secondary | ICD-10-CM | POA: Diagnosis not present

## 2023-05-05 DIAGNOSIS — Z6826 Body mass index (BMI) 26.0-26.9, adult: Secondary | ICD-10-CM | POA: Diagnosis not present

## 2023-06-07 DIAGNOSIS — Z79899 Other long term (current) drug therapy: Secondary | ICD-10-CM | POA: Diagnosis not present

## 2023-06-07 DIAGNOSIS — L700 Acne vulgaris: Secondary | ICD-10-CM | POA: Diagnosis not present

## 2023-07-03 ENCOUNTER — Other Ambulatory Visit: Payer: Self-pay | Admitting: Obstetrics and Gynecology

## 2023-07-03 DIAGNOSIS — N63 Unspecified lump in unspecified breast: Secondary | ICD-10-CM

## 2023-07-13 ENCOUNTER — Other Ambulatory Visit: Payer: Self-pay | Admitting: Obstetrics and Gynecology

## 2023-07-13 ENCOUNTER — Ambulatory Visit
Admission: RE | Admit: 2023-07-13 | Discharge: 2023-07-13 | Disposition: A | Discharge status: Home / Self Care | Payer: BC Managed Care – PPO | Source: Ambulatory Visit | Attending: Obstetrics and Gynecology | Admitting: Obstetrics and Gynecology

## 2023-07-13 DIAGNOSIS — R229 Localized swelling, mass and lump, unspecified: Secondary | ICD-10-CM | POA: Diagnosis not present

## 2023-07-13 DIAGNOSIS — N63 Unspecified lump in unspecified breast: Secondary | ICD-10-CM

## 2023-09-15 DIAGNOSIS — E559 Vitamin D deficiency, unspecified: Secondary | ICD-10-CM | POA: Diagnosis not present

## 2023-09-15 DIAGNOSIS — K5900 Constipation, unspecified: Secondary | ICD-10-CM | POA: Diagnosis not present

## 2023-09-15 DIAGNOSIS — Z0000 Encounter for general adult medical examination without abnormal findings: Secondary | ICD-10-CM | POA: Diagnosis not present

## 2023-09-22 DIAGNOSIS — Z0000 Encounter for general adult medical examination without abnormal findings: Secondary | ICD-10-CM | POA: Diagnosis not present

## 2023-12-08 DIAGNOSIS — L811 Chloasma: Secondary | ICD-10-CM | POA: Diagnosis not present

## 2023-12-08 DIAGNOSIS — Z79899 Other long term (current) drug therapy: Secondary | ICD-10-CM | POA: Diagnosis not present

## 2023-12-08 DIAGNOSIS — L700 Acne vulgaris: Secondary | ICD-10-CM | POA: Diagnosis not present

## 2024-02-09 DIAGNOSIS — Z833 Family history of diabetes mellitus: Secondary | ICD-10-CM | POA: Diagnosis not present

## 2024-02-09 DIAGNOSIS — Z0000 Encounter for general adult medical examination without abnormal findings: Secondary | ICD-10-CM | POA: Diagnosis not present

## 2024-02-09 DIAGNOSIS — R202 Paresthesia of skin: Secondary | ICD-10-CM | POA: Diagnosis not present

## 2024-02-27 DIAGNOSIS — R202 Paresthesia of skin: Secondary | ICD-10-CM | POA: Diagnosis not present

## 2024-02-27 DIAGNOSIS — R768 Other specified abnormal immunological findings in serum: Secondary | ICD-10-CM | POA: Diagnosis not present

## 2024-02-27 DIAGNOSIS — E559 Vitamin D deficiency, unspecified: Secondary | ICD-10-CM | POA: Diagnosis not present

## 2024-03-11 ENCOUNTER — Encounter: Payer: Self-pay | Admitting: Neurology

## 2024-05-15 ENCOUNTER — Ambulatory Visit: Admitting: Neurology

## 2024-09-20 DIAGNOSIS — Z833 Family history of diabetes mellitus: Secondary | ICD-10-CM | POA: Diagnosis not present

## 2024-09-20 DIAGNOSIS — Z0000 Encounter for general adult medical examination without abnormal findings: Secondary | ICD-10-CM | POA: Diagnosis not present

## 2024-09-20 DIAGNOSIS — R5383 Other fatigue: Secondary | ICD-10-CM | POA: Diagnosis not present

## 2024-09-20 DIAGNOSIS — E559 Vitamin D deficiency, unspecified: Secondary | ICD-10-CM | POA: Diagnosis not present

## 2024-09-27 DIAGNOSIS — Z23 Encounter for immunization: Secondary | ICD-10-CM | POA: Diagnosis not present

## 2024-09-27 DIAGNOSIS — E559 Vitamin D deficiency, unspecified: Secondary | ICD-10-CM | POA: Diagnosis not present

## 2024-09-27 DIAGNOSIS — Z0000 Encounter for general adult medical examination without abnormal findings: Secondary | ICD-10-CM | POA: Diagnosis not present

## 2024-09-27 DIAGNOSIS — K219 Gastro-esophageal reflux disease without esophagitis: Secondary | ICD-10-CM | POA: Diagnosis not present
